# Patient Record
Sex: Male | Born: 1965 | Race: Black or African American | Hispanic: No | Smoking: Never smoker
Health system: Southern US, Community
[De-identification: ages and names within clinical notes are randomized; demographics above are authoritative.]

## PROBLEM LIST (undated history)

## (undated) DIAGNOSIS — Z923 Personal history of irradiation: Secondary | ICD-10-CM

## (undated) DIAGNOSIS — C801 Malignant (primary) neoplasm, unspecified: Secondary | ICD-10-CM

## (undated) HISTORY — DX: Personal history of irradiation: Z92.3

---

## 1997-08-17 ENCOUNTER — Ambulatory Visit (HOSPITAL_COMMUNITY): Admission: RE | Admit: 1997-08-17 | Discharge: 1997-08-17 | Payer: Self-pay | Admitting: *Deleted

## 1999-02-26 HISTORY — PX: KELOID EXCISION: SHX1856

## 1999-05-17 ENCOUNTER — Encounter: Admission: RE | Admit: 1999-05-17 | Discharge: 1999-08-15 | Payer: Self-pay | Admitting: Family Medicine

## 1999-05-21 ENCOUNTER — Ambulatory Visit (HOSPITAL_BASED_OUTPATIENT_CLINIC_OR_DEPARTMENT_OTHER): Admission: RE | Admit: 1999-05-21 | Discharge: 1999-05-21 | Payer: Self-pay | Admitting: Specialist

## 2001-03-30 ENCOUNTER — Emergency Department (HOSPITAL_COMMUNITY): Admission: EM | Admit: 2001-03-30 | Discharge: 2001-03-30 | Payer: Self-pay | Admitting: *Deleted

## 2002-06-25 ENCOUNTER — Ambulatory Visit: Admission: RE | Admit: 2002-06-25 | Discharge: 2002-07-07 | Payer: Self-pay | Admitting: Radiation Oncology

## 2002-08-22 ENCOUNTER — Emergency Department (HOSPITAL_COMMUNITY): Admission: EM | Admit: 2002-08-22 | Discharge: 2002-08-22 | Payer: Self-pay | Admitting: Emergency Medicine

## 2002-08-22 ENCOUNTER — Encounter: Payer: Self-pay | Admitting: Emergency Medicine

## 2004-02-26 HISTORY — PX: CHOLECYSTECTOMY: SHX55

## 2004-03-30 ENCOUNTER — Emergency Department (HOSPITAL_COMMUNITY): Admission: EM | Admit: 2004-03-30 | Discharge: 2004-03-30 | Payer: Self-pay | Admitting: Emergency Medicine

## 2004-08-04 ENCOUNTER — Emergency Department (HOSPITAL_COMMUNITY): Admission: EM | Admit: 2004-08-04 | Discharge: 2004-08-04 | Payer: Self-pay | Admitting: Emergency Medicine

## 2004-08-15 ENCOUNTER — Emergency Department (HOSPITAL_COMMUNITY): Admission: EM | Admit: 2004-08-15 | Discharge: 2004-08-15 | Payer: Self-pay | Admitting: Emergency Medicine

## 2004-12-28 ENCOUNTER — Observation Stay (HOSPITAL_COMMUNITY): Admission: EM | Admit: 2004-12-28 | Discharge: 2004-12-29 | Payer: Self-pay | Admitting: Emergency Medicine

## 2004-12-28 ENCOUNTER — Encounter (INDEPENDENT_AMBULATORY_CARE_PROVIDER_SITE_OTHER): Payer: Self-pay | Admitting: Specialist

## 2005-12-30 ENCOUNTER — Encounter: Admission: RE | Admit: 2005-12-30 | Discharge: 2006-03-30 | Payer: Self-pay | Admitting: Orthopedic Surgery

## 2006-04-11 ENCOUNTER — Emergency Department (HOSPITAL_COMMUNITY): Admission: EM | Admit: 2006-04-11 | Discharge: 2006-04-11 | Payer: Self-pay | Admitting: Family Medicine

## 2007-04-04 ENCOUNTER — Emergency Department (HOSPITAL_COMMUNITY): Admission: EM | Admit: 2007-04-04 | Discharge: 2007-04-04 | Payer: Self-pay | Admitting: Family Medicine

## 2007-06-25 ENCOUNTER — Emergency Department (HOSPITAL_COMMUNITY): Admission: EM | Admit: 2007-06-25 | Discharge: 2007-06-25 | Payer: Self-pay | Admitting: Emergency Medicine

## 2008-09-24 ENCOUNTER — Emergency Department (HOSPITAL_BASED_OUTPATIENT_CLINIC_OR_DEPARTMENT_OTHER): Admission: EM | Admit: 2008-09-24 | Discharge: 2008-09-24 | Payer: Self-pay | Admitting: Emergency Medicine

## 2008-09-24 ENCOUNTER — Ambulatory Visit: Payer: Self-pay | Admitting: Diagnostic Radiology

## 2008-11-08 ENCOUNTER — Encounter: Admission: RE | Admit: 2008-11-08 | Discharge: 2008-11-08 | Payer: Self-pay | Admitting: Orthopedic Surgery

## 2008-12-10 ENCOUNTER — Emergency Department (HOSPITAL_COMMUNITY): Admission: EM | Admit: 2008-12-10 | Discharge: 2008-12-10 | Payer: Self-pay | Admitting: Emergency Medicine

## 2009-01-28 ENCOUNTER — Emergency Department (HOSPITAL_COMMUNITY): Admission: EM | Admit: 2009-01-28 | Discharge: 2009-01-28 | Payer: Self-pay | Admitting: Emergency Medicine

## 2009-02-03 ENCOUNTER — Emergency Department (HOSPITAL_BASED_OUTPATIENT_CLINIC_OR_DEPARTMENT_OTHER): Admission: EM | Admit: 2009-02-03 | Discharge: 2009-02-03 | Payer: Self-pay | Admitting: Emergency Medicine

## 2009-09-17 ENCOUNTER — Emergency Department (HOSPITAL_BASED_OUTPATIENT_CLINIC_OR_DEPARTMENT_OTHER): Admission: EM | Admit: 2009-09-17 | Discharge: 2009-09-17 | Payer: Self-pay | Admitting: Emergency Medicine

## 2010-03-19 ENCOUNTER — Ambulatory Visit: Admission: RE | Admit: 2010-03-19 | Payer: Self-pay | Source: Home / Self Care | Admitting: Specialist

## 2010-05-12 LAB — COMPREHENSIVE METABOLIC PANEL
ALT: 26 U/L (ref 0–53)
Calcium: 9 mg/dL (ref 8.4–10.5)
GFR calc Af Amer: >60 mL/min (ref >60)
Glucose, Bld: 92 mg/dL (ref 70–99)
Sodium: 145 mEq/L (ref 135–145)
Total Protein: 8 g/dL (ref 6.0–8.3)

## 2010-05-12 LAB — URINALYSIS, ROUTINE W REFLEX MICROSCOPIC
Glucose, UA: NEGATIVE mg/dL
Ketones, ur: NEGATIVE mg/dL
Specific Gravity, Urine: 1.03 (ref 1.005–1.030)
pH: 5.5 (ref 5.0–8.0)

## 2010-05-12 LAB — DIFFERENTIAL
Eosinophils Absolute: 0.1 10*3/uL (ref 0.0–0.7)
Lymphocytes Relative: 31 % (ref 12–46)
Lymphs Abs: 2.4 10*3/uL (ref 0.7–4.0)
Monocytes Relative: 9 % (ref 3–12)
Neutrophils Relative %: 57 % (ref 43–77)

## 2010-05-12 LAB — CBC
MCV: 86 fL (ref 78.0–100.0)
Platelets: 266 10*3/uL (ref 150–400)
RDW: 12.4 % (ref 11.5–15.5)
WBC: 7.7 10*3/uL (ref 4.0–10.5)

## 2010-07-13 NOTE — Op Note (Signed)
[place]. [facility]  Patient:    Duane Jackson, Duane Jackson                     MRN: Proc. Date: 05/21/99 Attending:  Earvin Hansen L. Shon Hough, M.D. CC:         Duane Jackson. Shon Hough, M.D.                           Operative Report  PREOPERATIVE DIAGNOSIS:  POSTOPERATIVE DIAGNOSIS:  OPERATION PERFORMED:  Wide excision of folliculoma, folliculitis of the scalp, keloid and then flap closure.  SURGEON:  Duane Jackson. Shon Hough, M.D.  ASSISTANT:  ANESTHESIA:  General.  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 The patient has had to have serial injections of steroid medications to keep the areas under control from burning, itching sensations.  He is now ready for having excision of the area with low dose postoperative irradiation planned.  DESCRIPTION OF PROCEDURE:  The patient underwent general anesthesia intubated orally.  He was then placed in a prone position.  Prep was done to the scalp and neck areas using Betadine soap and solution and walled off with sterile towels nd drapes so as to make a sterile field.  Wide excision was made of the area of the scalp down to underlying fascia overlying the musculature.  Hemostasis was maintained with the Bovie unit on coagulation after removing the area with some  difficulty, increased scarring and bleeding.  The superior and inferior flaps were freed up approximately 4 cm each to allow closure of the defects without tension using 2-0 Monocryl x 2 layers and then a running locking suture of 3-0 nylon. he wound was drained with #10 Blake drain which was placed in the depths of the wound and brought out through the lateralmost portion of the incision and secured with 3-0 Prolene.  The wounds were cleansed.  Sterile dressings were applied to all he areas.  0.5% Xylocaine with epinephrine was used  locally.  At the end of the case, 0.25% Marcaine with epinephrine for sustaining of the analgesia.  Estimated blood loss for this procedure was 100 cc.  COMPLICATIONS:  None. DD:  05/21/99 TD:  05/22/99 Job: [id number]

## 2010-07-13 NOTE — Op Note (Signed)
NAME:  Duane Jackson, Duane Jackson NO.:  [id number]   MEDICAL RECORD NO.:  [id number]          PATIENT TYPE:  OBV   LOCATION:  1610                         FACILITY:  [facility]   PHYSICIAN:  Lebron Conners, M.D.   DATE OF BIRTH:  01/01/1966   DATE OF PROCEDURE:  12/28/2004  DATE OF DISCHARGE:                                 OPERATIVE REPORT   PREOPERATIVE DIAGNOSES:  Cholelithiasis and acute cholecystitis   POSTOPERATIVE DIAGNOSES:  Cholelithiasis and acute cholecystitis   OPERATION:  Laparoscopic cholecystectomy.   SURGEON:  Lebron Conners, M.D.   ANESTHESIA:  General and local.   PROCEDURE:  After the patient was monitored and given general anesthesia and  had routine preparation and draping of the abdomen, I infiltrated the area  just below the umbilicus with long-acting local anesthetic. I made about a 2  1/2  cm transverse incision and dissected down through the fat to the  midline fascia which I opened longitudinally for about 2 cm. I bluntly  entered the peritoneal cavity and dilated the tract and then placed a #0  Vicryl pursestring suture and secured a Hassan cannula. I put in the  laparoscopic and examined the abdominal contents. The gallbladder was  slightly distended but not truly acute and in appearance. I felt that I  should be sure the patient did not have appendicitis since I did not think  the appearance of the gallbladder quite explained all of his severe pain and  fever and elevation of white count. I looked for the appendix and could not  at first see it. I mobilized the cecum by taking down adhesions and also  mobilized the distal ileum taking down adhesions with the scissors and  cautery. I could see the base of the appendix. The appendix was retrocecal  and retroperitoneal and there was no evidence of inflammation in that  region. I concluded that he did not have appendicitis and discontinued that  dissection. There was a tiny bit of oozing and I put  some Surgicel in that  area. I then had the patient positioned head-up foot down and tilted to the  left. I should mention that I had placed the three additional laparoscopic  ports in standard locations for cholecystectomy under direct view of the  laparoscope and they went in and atraumatically. I grasped the fundus of the  gallbladder and retracted it toward the right shoulder. There were some  somewhat chronic and some acute appearing adhesions of omentum to the  undersurface of the gallbladder. I dissected those away with cautery and  with blunt dissection and then was able to grasp the infundibulum of the  gallbladder and pull it laterally. I then dissected the hepatoduodenal  ligament anteriorly and posteriorly making a very wide window between the  cystic duct and the liver. I noted the emergence of the cystic duct from the  gallbladder and then noted the cystic artery crossing medial to it through  the triangle of Calot and branching on the anterior surface of the  gallbladder. Since the liver enzymes were within normal limits and the  patient had not had  any pain suggestive of choledocholithiasis, I clipped  the cystic duct with three clips and cut between the two closest to the  gallbladder. There was a small amount of sludge and tiny stones in the  cystic duct at that point. I cut the cystic artery and then completed the  dissection of the gallbladder from the liver by clipping a couple more small  blood vessel seen to be going into the gallbladder and cauterizing the  remainder of the peritoneal attachments and posterior attachments. The  hemostasis was excellent. There was one tiny hole in the fundus of the  gallbladder which the grasper had made and so when I removed the gallbladder  from the liver I placed it in a plastic pouch and held it above the liver. I  copiously irrigated the right upper quadrant and removed the irrigant and  then again inspected the dissected area  around the cecum and found that  hemostasis was good there as well. I carefully examined the small bowel and  other viscera and saw no other abnormalities. I removed the gallbladder from  the body through the umbilical incision and tied the pursestring suture  and then removed the two lateral ports under direct view after removing the  remaining irrigant. I allowed the carbon dioxide to escape from the  epigastric port and removed that as well. I closed all of the skin incisions  accurately with intracuticular 4-0 Vicryl and Steri-Strips and applied  bandages. He tolerated the operation well.      Lebron Conners, M.D.  Electronically Signed     WB/MEDQ  D:  12/28/2004  T:  12/28/2004  Job:  [id number]

## 2011-02-25 ENCOUNTER — Encounter (HOSPITAL_BASED_OUTPATIENT_CLINIC_OR_DEPARTMENT_OTHER): Payer: Self-pay | Admitting: *Deleted

## 2011-02-25 NOTE — Progress Notes (Signed)
No meds Waiting on dr Irven Coe oders-pt to come in for labs

## 2011-03-01 ENCOUNTER — Other Ambulatory Visit: Payer: Self-pay | Admitting: Specialist

## 2011-03-01 MED ORDER — DEXTROSE IN LACTATED RINGERS 5 % IV SOLN: INTRAVENOUS | Status: DC

## 2011-03-01 NOTE — H&P (Signed)
Duane Jackson is an 45 y.o. male.   Chief Complaint: mass on left face HPI:   Past Medical History  Diagnosis Date  . No pertinent past medical history     Past Surgical History  Procedure Date  . Cholecystectomy 2006    lap choli  . Keloid excision 2001    scalp    No family history on file. Social History:  reports that he has never smoked. He does not have any smokeless tobacco history on file. He reports that he drinks alcohol. He reports that he does not use illicit drugs.  Allergies:  Allergies  Allergen Reactions  . Codeine Nausea And Vomiting    Medications Prior to Admission  Medication Sig Dispense Refill  . acetaminophen (TYLENOL) 325 MG tablet Take 650 mg by mouth every 6 (six) hours as needed.         Medications Prior to Admission  Medication Dose Route Frequency Provider Last Rate Last Dose  . dextrose 5 % in lactated ringers infusion   Intravenous Continuous Ilya Ess Jackson Ademide Schaberg        No results found for this or any previous visit (from the past 48 hour(s)). No results found.  ROS  There were no vitals taken for this visit. Physical Exam   Assessment/Plan   Duane Jackson 03/01/2011, 11:58 AM    

## 2011-03-04 ENCOUNTER — Encounter (HOSPITAL_BASED_OUTPATIENT_CLINIC_OR_DEPARTMENT_OTHER): Payer: Self-pay | Admitting: *Deleted

## 2011-03-04 ENCOUNTER — Ambulatory Visit (HOSPITAL_BASED_OUTPATIENT_CLINIC_OR_DEPARTMENT_OTHER): Payer: BC Managed Care – PPO | Admitting: Anesthesiology

## 2011-03-04 ENCOUNTER — Encounter (HOSPITAL_BASED_OUTPATIENT_CLINIC_OR_DEPARTMENT_OTHER): Payer: Self-pay | Admitting: Anesthesiology

## 2011-03-04 ENCOUNTER — Other Ambulatory Visit: Payer: Self-pay | Admitting: Specialist

## 2011-03-04 ENCOUNTER — Ambulatory Visit (HOSPITAL_BASED_OUTPATIENT_CLINIC_OR_DEPARTMENT_OTHER)
Admission: RE | Admit: 2011-03-04 | Discharge: 2011-03-04 | Disposition: A | Discharge status: Home / Self Care | Payer: BC Managed Care – PPO | Source: Ambulatory Visit | Attending: Specialist | Admitting: Specialist

## 2011-03-04 ENCOUNTER — Encounter (HOSPITAL_BASED_OUTPATIENT_CLINIC_OR_DEPARTMENT_OTHER)
Admission: RE | Disposition: A | Discharge status: Home / Self Care | Payer: Self-pay | Source: Ambulatory Visit | Attending: Specialist

## 2011-03-04 DIAGNOSIS — C07 Malignant neoplasm of parotid gland: Secondary | ICD-10-CM | POA: Insufficient documentation

## 2011-03-04 DIAGNOSIS — C801 Malignant (primary) neoplasm, unspecified: Secondary | ICD-10-CM

## 2011-03-04 HISTORY — DX: Malignant (primary) neoplasm, unspecified: C80.1

## 2011-03-04 HISTORY — PX: MASS EXCISION: SHX2000

## 2011-03-04 SURGERY — EXCISION MASS
Anesthesia: General | Site: Face | Laterality: Left | Wound class: Clean

## 2011-03-04 MED ORDER — SODIUM CHLORIDE 0.9 % IV SOLN
INTRAVENOUS | Status: DC | PRN
  Administered 2011-03-04: 5 mL via INTRAMUSCULAR

## 2011-03-04 MED ORDER — PROMETHAZINE HCL 25 MG/ML IJ SOLN: 6.2500 mg | INTRAMUSCULAR | Status: DC | PRN

## 2011-03-04 MED ORDER — MORPHINE SULFATE 2 MG/ML IJ SOLN: 0.0500 mg/kg | INTRAMUSCULAR | Status: DC | PRN

## 2011-03-04 MED ORDER — DEXAMETHASONE SODIUM PHOSPHATE 4 MG/ML IJ SOLN
INTRAMUSCULAR | Status: DC | PRN
  Administered 2011-03-04: 10 mg via INTRAVENOUS

## 2011-03-04 MED ORDER — LIDOCAINE-EPINEPHRINE 0.5-1:200000 % IJ SOLN
INTRAMUSCULAR | Status: DC | PRN
  Administered 2011-03-04: 8 mL

## 2011-03-04 MED ORDER — CEFAZOLIN SODIUM 1-5 GM-% IV SOLN: 1.0000 g | INTRAVENOUS | Status: DC

## 2011-03-04 MED ORDER — HYDROCODONE-ACETAMINOPHEN 5-325 MG PO TABS
1.0000 | ORAL_TABLET | Freq: Once | ORAL | Status: AC
  Administered 2011-03-04: 1 via ORAL

## 2011-03-04 MED ORDER — LIDOCAINE HCL (CARDIAC) 20 MG/ML IV SOLN
INTRAVENOUS | Status: DC | PRN
  Administered 2011-03-04: 80 mg via INTRAVENOUS

## 2011-03-04 MED ORDER — LACTATED RINGERS IV SOLN: INTRAVENOUS | Status: DC

## 2011-03-04 MED ORDER — CEFAZOLIN SODIUM-DEXTROSE 2-3 GM-% IV SOLR
2.0000 g | INTRAVENOUS | Status: AC
  Administered 2011-03-04: 2 g via INTRAVENOUS

## 2011-03-04 MED ORDER — LACTATED RINGERS IV SOLN
INTRAVENOUS | Status: DC
  Administered 2011-03-04: 08:00:00 via INTRAVENOUS

## 2011-03-04 MED ORDER — HYDROMORPHONE HCL PF 1 MG/ML IJ SOLN: 0.2500 mg | INTRAMUSCULAR | Status: DC | PRN

## 2011-03-04 MED ORDER — ONDANSETRON HCL 4 MG/2ML IJ SOLN
INTRAMUSCULAR | Status: DC | PRN
  Administered 2011-03-04: 4 mg via INTRAVENOUS

## 2011-03-04 MED ORDER — MIDAZOLAM HCL 5 MG/5ML IJ SOLN
INTRAMUSCULAR | Status: DC | PRN
  Administered 2011-03-04: 2 mg via INTRAVENOUS

## 2011-03-04 MED ORDER — TRIAMCINOLONE ACETONIDE 40 MG/ML IJ SUSP
INTRAMUSCULAR | Status: DC | PRN
  Administered 2011-03-04: 40 mg via INTRADERMAL

## 2011-03-04 MED ORDER — PROPOFOL 10 MG/ML IV EMUL
INTRAVENOUS | Status: DC | PRN
  Administered 2011-03-04: 250 mg via INTRAVENOUS

## 2011-03-04 MED ORDER — FENTANYL CITRATE 0.05 MG/ML IJ SOLN
INTRAMUSCULAR | Status: DC | PRN
  Administered 2011-03-04: 50 ug via INTRAVENOUS
  Administered 2011-03-04 (×2): 25 ug via INTRAVENOUS

## 2011-03-04 MED ORDER — MEPERIDINE HCL 25 MG/ML IJ SOLN: 6.2500 mg | INTRAMUSCULAR | Status: DC | PRN

## 2011-03-04 MED ORDER — LACTATED RINGERS IV SOLN
INTRAVENOUS | Status: DC
  Administered 2011-03-04: 07:00:00 via INTRAVENOUS

## 2011-03-04 SURGICAL SUPPLY — 54 items
APL SKNCLS STERI-STRIP NONHPOA (GAUZE/BANDAGES/DRESSINGS) ×1
BALL CTTN LRG ABS STRL LF (GAUZE/BANDAGES/DRESSINGS) ×1
BANDAGE GAUZE ELAST BULKY 4 IN (GAUZE/BANDAGES/DRESSINGS) IMPLANT
BENZOIN TINCTURE PRP APPL 2/3 (GAUZE/BANDAGES/DRESSINGS) ×1 IMPLANT
BLADE KNIFE PERSONA 10 (BLADE) ×2 IMPLANT
BLADE KNIFE PERSONA 15 (BLADE) ×2 IMPLANT
CANISTER SUCTION 1200CC (MISCELLANEOUS) IMPLANT
CLEANER CAUTERY TIP 5X5 PAD (MISCELLANEOUS) ×1 IMPLANT
CLOTH BEACON ORANGE TIMEOUT ST (SAFETY) ×2 IMPLANT
COTTONBALL LRG STERILE PKG (GAUZE/BANDAGES/DRESSINGS) ×1 IMPLANT
COVER MAYO STAND STRL (DRAPES) ×2 IMPLANT
COVER TABLE BACK 60X90 (DRAPES) ×2 IMPLANT
DRAPE PED LAPAROTOMY (DRAPES) IMPLANT
DRAPE U-SHAPE 76X120 STRL (DRAPES) ×1 IMPLANT
DRSG PAD ABDOMINAL 8X10 ST (GAUZE/BANDAGES/DRESSINGS) IMPLANT
ELECT NDL TIP 2.8 STRL (NEEDLE) ×1 IMPLANT
ELECT NEEDLE TIP 2.8 STRL (NEEDLE) ×2 IMPLANT
ELECT REM PT RETURN 9FT ADLT (ELECTROSURGICAL) ×2
ELECTRODE REM PT RTRN 9FT ADLT (ELECTROSURGICAL) ×1 IMPLANT
GAUZE SPONGE 4X4 12PLY STRL LF (GAUZE/BANDAGES/DRESSINGS) ×1 IMPLANT
GAUZE SPONGE 4X4 16PLY XRAY LF (GAUZE/BANDAGES/DRESSINGS) IMPLANT
GAUZE XEROFORM 5X9 LF (GAUZE/BANDAGES/DRESSINGS) IMPLANT
GLOVE BIO SURGEON STRL SZ 6.5 (GLOVE) ×1 IMPLANT
GLOVE BIOGEL M STRL SZ7.5 (GLOVE) ×1 IMPLANT
GLOVE ECLIPSE 7.0 STRL STRAW (GLOVE) ×2 IMPLANT
GLOVE INDICATOR 8.0 STRL GRN (GLOVE) ×1 IMPLANT
GOWN PREVENTION PLUS XLARGE (GOWN DISPOSABLE) ×1 IMPLANT
GOWN PREVENTION PLUS XXLARGE (GOWN DISPOSABLE) ×2 IMPLANT
LOCATOR NERVE 3 VOLT (DISPOSABLE) ×1 IMPLANT
NDL HYPO 25X1 1.5 SAFETY (NEEDLE) ×1 IMPLANT
NDL SAFETY ECLIPSE 18X1.5 (NEEDLE) IMPLANT
NEEDLE HYPO 18GX1.5 SHARP (NEEDLE) ×2
NEEDLE HYPO 25X1 1.5 SAFETY (NEEDLE) ×4 IMPLANT
PACK BASIN DAY SURGERY FS (CUSTOM PROCEDURE TRAY) ×2 IMPLANT
PAD CLEANER CAUTERY TIP 5X5 (MISCELLANEOUS)
PENCIL BUTTON HOLSTER BLD 10FT (ELECTRODE) ×2 IMPLANT
SHEET MEDIUM DRAPE 40X70 STRL (DRAPES) ×1 IMPLANT
SPONGE GAUZE 4X4 12PLY (GAUZE/BANDAGES/DRESSINGS) IMPLANT
STAPLER VISISTAT 35W (STAPLE) ×1 IMPLANT
STERI STRIP BROWN 1/4X3 R155 (GAUZE/BANDAGES/DRESSINGS) ×1 IMPLANT
STRIP SUTURE WOUND CLOSURE 1/2 (SUTURE) IMPLANT
SUCTION FRAZIER TIP 10 FR DISP (SUCTIONS) IMPLANT
SUT MNCRL AB 3-0 PS2 18 (SUTURE) ×1 IMPLANT
SUT PROLENE 4 0 P 3 18 (SUTURE) IMPLANT
SUT PROLENE 4 0 PS 2 18 (SUTURE) IMPLANT
SUT SILK 3 0 PS 1 (SUTURE) IMPLANT
SUT VIC AB 3-0 FS2 27 (SUTURE) IMPLANT
SYR CONTROL 10ML LL (SYRINGE) ×3 IMPLANT
TAPE HYPAFIX 4 X10 (GAUZE/BANDAGES/DRESSINGS) ×1 IMPLANT
TAPE HYPAFIX 6X30 (GAUZE/BANDAGES/DRESSINGS) IMPLANT
TOWEL OR 17X24 6PK STRL BLUE (TOWEL DISPOSABLE) ×4 IMPLANT
TUBE CONNECTING 20X1/4 (TUBING) IMPLANT
UNDERPAD 30X30 INCONTINENT (UNDERPADS AND DIAPERS) ×1 IMPLANT
WATER STERILE IRR 1000ML POUR (IV SOLUTION) ×1 IMPLANT

## 2011-03-04 NOTE — Anesthesia Procedure Notes (Signed)
Procedure Name: LMA Insertion Performed by: Maralyn Witherell Pre-anesthesia Checklist: Patient identified, Timeout performed, Emergency Drugs available, Suction available and Patient being monitored Patient Re-evaluated:Patient Re-evaluated prior to inductionOxygen Delivery Method: Circle System Utilized Preoxygenation: Pre-oxygenation with 100% oxygen Intubation Type: IV induction Ventilation: Mask ventilation without difficulty LMA: LMA with gastric port inserted LMA Size: 4.0 Number of attempts: 1 Placement Confirmation: breath sounds checked- equal and bilateral and positive ETCO2 Tube secured with: Tape Dental Injury: Teeth and Oropharynx as per pre-operative assessment      

## 2011-03-04 NOTE — Interval H&P Note (Signed)
History and Physical Interval Note:  03/04/2011 7:48 AM  Duane Jackson  has presented today for surgery, with the diagnosis of mass  The various methods of treatment have been discussed with the patient and family. After consideration of risks, benefits and other options for treatment, the patient has consented to  Procedure(s): EXCISION MASS as a surgical intervention .  The patients' history has been reviewed, patient examined, no change in status, stable for surgery.  I have reviewed the patients' chart and labs.  Questions were answered to the patient's satisfaction.     Carlos Heber L

## 2011-03-04 NOTE — Anesthesia Postprocedure Evaluation (Signed)
  Anesthesia Post-op Note  Patient: Duane Jackson  Procedure(s) Performed:  EXCISION MASS - excision mass left face   Patient Location: PACU  Anesthesia Type: General  Level of Consciousness: awake  Airway and Oxygen Therapy: Patient Spontanous Breathing  Post-op Pain: mild  Post-op Assessment: Post-op Vital signs reviewed  Post-op Vital Signs: stable  Complications: No apparent anesthesia complications

## 2011-03-04 NOTE — Anesthesia Preprocedure Evaluation (Signed)
Anesthesia Evaluation  Patient identified by MRN, date of birth, ID band  Reviewed: Allergy & Precautions, H&P , NPO status   Airway Mallampati: II      Dental   Pulmonary  clear to auscultation        Cardiovascular neg cardio ROS Regular Normal    Neuro/Psych    GI/Hepatic negative GI ROS, Neg liver ROS,   Endo/Other  Negative Endocrine ROS  Renal/GU negative Renal ROS     Musculoskeletal negative musculoskeletal ROS (+)   Abdominal   Peds  Hematology negative hematology ROS (+)   Anesthesia Other Findings   Reproductive/Obstetrics                           Anesthesia Physical Anesthesia Plan  ASA: I  Anesthesia Plan: General   Post-op Pain Management:    Induction: Intravenous  Airway Management Planned: Mask  Additional Equipment:   Intra-op Plan:   Post-operative Plan:   Informed Consent: I have reviewed the patients History and Physical, chart, labs and discussed the procedure including the risks, benefits and alternatives for the proposed anesthesia with the patient or authorized representative who has indicated his/her understanding and acceptance.     Plan Discussed with:   Anesthesia Plan Comments:         Anesthesia Quick Evaluation

## 2011-03-04 NOTE — H&P (View-Only) (Signed)
Joshawa Dubin is an 46 y.o. male.   Chief Complaint: mass on left face HPI:   Past Medical History  Diagnosis Date  . No pertinent past medical history     Past Surgical History  Procedure Date  . Cholecystectomy 2006    lap choli  . Keloid excision 2001    scalp    No family history on file. Social History:  reports that he has never smoked. He does not have any smokeless tobacco history on file. He reports that he drinks alcohol. He reports that he does not use illicit drugs.  Allergies:  Allergies  Allergen Reactions  . Codeine Nausea And Vomiting    Medications Prior to Admission  Medication Sig Dispense Refill  . acetaminophen (TYLENOL) 325 MG tablet Take 650 mg by mouth every 6 (six) hours as needed.         Medications Prior to Admission  Medication Dose Route Frequency Provider Last Rate Last Dose  . dextrose 5 % in lactated ringers infusion   Intravenous Continuous Yaakov Guthrie Airica Schwartzkopf        No results found for this or any previous visit (from the past 48 hour(s)). No results found.  ROS  There were no vitals taken for this visit. Physical Exam   Assessment/Plan   Sheyli Horwitz L 03/01/2011, 11:58 AM

## 2011-03-04 NOTE — Brief Op Note (Signed)
03/04/2011  9:02 AM  PATIENT:  Duane Jackson  46 y.o. male  PRE-OPERATIVE DIAGNOSIS:  mass left face  POST-OPERATIVE DIAGNOSIS:  mass left face  PROCEDURE:  Procedure(s): EXCISION MASS  SURGEON:  Surgeon(s): Yaakov Guthrie Aasha Dina  PHYSICIAN ASSISTANT:   ASSISTANTS: none   ANESTHESIA:   general  EBL:  Total I/O In: 1500 [I.V.:1500] Out: -   BLOOD ADMINISTERED:none  DRAINS: none   LOCAL MEDICATIONS USED:  XYLOCAINE 10CC  SPECIMEN:  Excision  DISPOSITION OF SPECIMEN:  PATHOLOGY  COUNTS:  YES  TOURNIQUET:  * No tourniquets in log *  DICTATION: .Other Dictation: Dictation Number [id number]  PLAN OF CARE: Discharge to home after PACU  PATIENT DISPOSITION:  PACU - hemodynamically stable.   Delay start of Pharmacological VTE agent (>24hrs) due to surgical blood loss or risk of bleeding:  {YES/NO/NOT APPLICABLE:20182

## 2011-03-04 NOTE — Op Note (Signed)
NAMEANTONIOS, OSTROW NO.:  [id number]  MEDICAL RECORD NO.:  [id number]  LOCATION:                                 FACILITY:  PHYSICIAN:  Earvin Hansen L. Shon Hough, M.D.DATE OF BIRTH:  05-20-1965  DATE OF PROCEDURE:  03/04/2011 DATE OF DISCHARGE:                              OPERATIVE REPORT   A 46 year old gentleman with a left parotid tumor, increased in size over the last 6-8 months.  PROCEDURES DONE:  Exploration of left face, left superficial parotidectomy with excision of mass with dissection of the facial nerve.  SURGEON:  Yaakov Guthrie. Shon Hough, MD  ANESTHESIA:  General.  The patient underwent general anesthesia and intubated orally.  Prep was done to the face and neck areas using Betadine soap and solution, walled off with sterile towels and drapes so as to make a sterile field.  0.5% Xylocaine with epinephrine was injected into the incision lines, traditional incision markings, pre and postauricular in a curvy lazy S, I used a total of 10 mL.  An incision was made over these areas with a #15 blade.  The dissection carried down to superficial fascia.  I was able then to dissect out the mass and then trace out the main trunk of the facial nerve.  I dissected it over using delicate dissection and removing the mass in the superficial parotid area at the same time.  I did use an intraoperative nerve stimulator for guidance.  After mass had been removed, small bleeders were controlled with the Bovie anticoagulation, small 10.  After irrigation, after proper hemostasis, the flaps were then transposed.  This patient has a history of keloidosis and to hopefully prevent that in the future I injected the edges of the incisions with triamcinolone 20 mg/mL, a total approximately of 2 mL throughout the area, 3-0 Monocryl suture subcutaneously, then a running subcuticular stitch of 3-0 Monocryl. Steri-Strips and soft dressing were applied to all the areas.   He withstood the procedures very well and was taken to recovery in excellent condition.     Yaakov Guthrie. Shon Hough, M.D.     Cathie Hoops  D:  03/04/2011  T:  03/04/2011  Job:  [id number]

## 2011-03-04 NOTE — Transfer of Care (Signed)
Immediate Anesthesia Transfer of Care Note  Patient: Duane Jackson  Procedure(s) Performed:  EXCISION MASS - excision mass left face   Patient Location: PACU  Anesthesia Type: General  Level of Consciousness: awake  Airway & Oxygen Therapy: Patient Spontanous Breathing  Post-op Assessment: Report given to PACU RN and Post -op Vital signs reviewed and stable  Post vital signs: Reviewed and stable  Complications: No apparent anesthesia complications

## 2011-03-05 ENCOUNTER — Encounter (HOSPITAL_BASED_OUTPATIENT_CLINIC_OR_DEPARTMENT_OTHER): Payer: Self-pay | Admitting: Specialist

## 2011-03-14 ENCOUNTER — Telehealth: Payer: Self-pay | Admitting: Oncology

## 2011-03-14 NOTE — Telephone Encounter (Signed)
called pts lmovm to rtn call to schedule new pt appt  °

## 2011-03-15 ENCOUNTER — Encounter: Payer: Self-pay | Admitting: *Deleted

## 2011-03-15 ENCOUNTER — Ambulatory Visit: Payer: BC Managed Care – PPO | Admitting: Radiation Oncology

## 2011-03-15 ENCOUNTER — Ambulatory Visit
Admission: RE | Admit: 2011-03-15 | Discharge: 2011-03-15 | Disposition: A | Discharge status: Home / Self Care | Payer: BC Managed Care – PPO | Source: Ambulatory Visit | Attending: Radiation Oncology | Admitting: Radiation Oncology

## 2011-03-15 ENCOUNTER — Ambulatory Visit: Payer: BC Managed Care – PPO

## 2011-03-15 ENCOUNTER — Encounter: Payer: Self-pay | Admitting: Radiation Oncology

## 2011-03-15 ENCOUNTER — Telehealth: Payer: Self-pay | Admitting: Oncology

## 2011-03-15 VITALS — BP 135/82 | HR 76 | Temp 98.3°F | Resp 20 | Wt 194.1 lb

## 2011-03-15 DIAGNOSIS — C07 Malignant neoplasm of parotid gland: Secondary | ICD-10-CM

## 2011-03-15 DIAGNOSIS — Z923 Personal history of irradiation: Secondary | ICD-10-CM

## 2011-03-15 HISTORY — DX: Malignant (primary) neoplasm, unspecified: C80.1

## 2011-03-15 NOTE — Progress Notes (Signed)
Please see the Nurse Progress Note in the MD Initial Consult Encounter for this patient. 

## 2011-03-15 NOTE — Progress Notes (Signed)
Pt has no c/o today. Works as Nutritional therapist, Community education officer.

## 2011-03-15 NOTE — Progress Notes (Signed)
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 male who has a history of keloids. He was previously treated by Dr. Dayton Scrape for a keloid over the posterior right and left neck. He received 1050 cGy in 3 fractions over the period of one week between March 27 and 05/28/1999. The patient then developed a recurrent keloid over his posterior neck which may have been due to an infection at the suture line. He was then seen by Dr. Dan Humphreys and received a second course of radiation to the posterior neck of 1200 cGy in 3 fractions which he completed on 07/02/2002.  The patient reports that about 1 and 1/2 years ago he developed a bump on the left side of his face. He tried antibiotics as prescribed by his primary doctor but this did not help. He then saw an otolaryngologist at [facility], [place] who recommended a parotidectomy but the patient reports that this would' have involved a scar up to his frontal scalp and he was concerned that this could cause a large keloid. He then saw Dr. Shon Hough who has performed to keloid surgery for him in the past. He recommended a superficial parotidectomy with minimal incisions. He underwent surgery on 03/04/2011. The pathology revealed muco-epidermoid carcinoma that was low-grade measuring 1.5 cm. There is no evidence of angiolymphatic invasion and the resection margins were clear. The distance of tumor from closest margin was 0.1 cm. The mass with well-circumscribed and cystic. No lymph nodes were removed. The patient is recovering well from surgery. He has no other complaints. A consultation has been  requested with Dr. Gaylyn Rong of medical oncology.  PREVIOUS RADIATION THERAPY: Yes - as above   PAST MEDICAL HISTORY:  has a past medical history of No pertinent past medical history; S/P radiation therapy (05/22/99, 05/25/99, 05/28/99); and Cancer (03/04/11).     PAST SURGICAL HISTORY:  Past Surgical History  Procedure Date  . Cholecystectomy 2006    lap choli  . Keloid excision 2001    scalp  . Mass excision 03/04/2011    Procedure: EXCISION MASS;  Surgeon: Rosalio Macadamia;  Location: [facility];  Service: Plastics;  Laterality: Left;  excision mass left face      FAMILY HISTORY: family history is not on file.   SOCIAL HISTORY:  reports that he has never smoked. He does not have any smokeless tobacco history on file. He reports that he drinks alcohol. He reports that he does not use illicit drugs.   ALLERGIES: Codeine   MEDICATIONS:  Current Outpatient Prescriptions  Medication Sig Dispense Refill  . acetaminophen (TYLENOL) 325 MG tablet Take 650 mg by mouth every 6 (six) hours as needed.       No current facility-administered medications for this encounter.   Facility-Administered Medications Ordered in Other Encounters  Medication Dose Route Frequency Provider Last Rate Last Dose  . dextrose 5 % in lactated ringers infusion   Intravenous Continuous Rosalio Macadamia, MD          REVIEW OF SYSTEMS:  Negative for all of their systems other than that described above   PHYSICAL  EXAM:  weight is 194 lb 1.6 oz (88.043 kg). His oral temperature is 98.3 F (36.8 C). His blood pressure is 135/82 and his pulse is 76. His respiration is 20.   General: Alert and oriented, in no acute distress HEENT: Head is normocephalic.  Extraocular movements are intact. Oropharynx is clear with no mucosal lesions. He has a healing incision site in the left parotid bed Neck: Neck is supple, no palpable cervical or supraclavicular lymphadenopathy. Heart: Regular in rate and rhythm with  no murmurs, rubs, or gallops. Chest: Clear to auscultation bilaterally, with no rhonchi, wheezes, or rales. Extremities: No cyanosis or edema. Lymphatics: No concerning lymphadenopathy. Skin: He has no evidence of recurrent keloid over the posterior neck, but scarring is evident with hyperpigmentation in the area where he was previously treated with surgery and radiation Musculoskeletal: symmetric strength and muscle tone throughout. Neurologic: Cranial nerves II through XII are grossly intact. No obvious focalities. Speech is fluent. Coordination is intact. Psychiatric: Judgment and insight are intact. Affect is appropriate.    LABORATORY DATA:  Lab Results  Component Value Date   WBC 7.7 09/17/2009   HGB 14.7 09/17/2009   HCT 44.2 09/17/2009   MCV 86.0 09/17/2009   PLT 266 09/17/2009   Lab Results  Component Value Date   NA 145 09/17/2009   K 3.9 09/17/2009   CL 107 09/17/2009   CO2 24 09/17/2009   Lab Results  Component Value Date   ALT 26 09/17/2009   AST 33 09/17/2009   ALKPHOS 68 09/17/2009   BILITOT 0.7 09/17/2009   Radiology: As Above  Pathology: As Above   IMPRESSION/PLAN: This is a very pleasant 46 year old gentleman with a history of radiotherapy for keloid doses of the neck. He now presents with a left parotid gland muco-epidermoid carcinoma. This was completely resected and was low-grade. I do not see an indication for postoperative radiotherapy. His cancer specific survival is at least 95% based on the [facility] clinic data. I informed the patient of this and he was very happy to hear this news.  I do not know of any indications for postoperative chemotherapy but I will defer to Dr. Gaylyn Rong for his expertise or that.  Of note, I do not see an indication for prophylactic radiation treatment against keloids either. Usually, this is best administered within 36 hours of surgery and my understanding is that Dr. Shon Hough, according to the op note, injected triamcinolone for preventative  measures.   It is difficult to say what the underlying cause of this parotid tumor was. The patient was consented previously for a small risk of secondary malignancy from radiation. I cannot exclude the possibility that this is a secondary malignancy.  It was a pleasure meeting Mr. Maniscalco and he should not hesitate to call me if he has any questions in the future.

## 2011-03-15 NOTE — Telephone Encounter (Signed)
called pt lmovm to rtn call to schedule new pt appt for 1-30

## 2011-03-25 ENCOUNTER — Telehealth: Payer: Self-pay | Admitting: Oncology

## 2011-03-25 NOTE — Telephone Encounter (Signed)
Referred by Dr. Shon Hough Dx- Head/Necl

## 2011-03-25 NOTE — Telephone Encounter (Signed)
called pt and scheduled NEW PT appt for 03/29/2011.  faxed over a letter to Dr.[place]

## 2011-03-27 ENCOUNTER — Ambulatory Visit: Payer: BC Managed Care – PPO

## 2011-03-27 ENCOUNTER — Other Ambulatory Visit: Payer: BC Managed Care – PPO

## 2011-03-27 ENCOUNTER — Ambulatory Visit: Payer: BC Managed Care – PPO | Admitting: Oncology

## 2011-03-29 ENCOUNTER — Ambulatory Visit (HOSPITAL_BASED_OUTPATIENT_CLINIC_OR_DEPARTMENT_OTHER): Payer: BC Managed Care – PPO | Admitting: Oncology

## 2011-03-29 ENCOUNTER — Encounter: Payer: Self-pay | Admitting: Oncology

## 2011-03-29 ENCOUNTER — Other Ambulatory Visit: Payer: BC Managed Care – PPO | Admitting: Lab

## 2011-03-29 ENCOUNTER — Ambulatory Visit: Payer: BC Managed Care – PPO

## 2011-03-29 VITALS — BP 130/87 | HR 75 | Temp 97.3°F | Ht 68.0 in | Wt 193.8 lb

## 2011-03-29 DIAGNOSIS — Z9889 Other specified postprocedural states: Secondary | ICD-10-CM

## 2011-03-29 DIAGNOSIS — C07 Malignant neoplasm of parotid gland: Secondary | ICD-10-CM

## 2011-03-29 DIAGNOSIS — D490 Neoplasm of unspecified behavior of digestive system: Secondary | ICD-10-CM

## 2011-03-29 NOTE — Progress Notes (Signed)
Duane Jackson  Referral MD:  Dr. Louisa Second, M.D.  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   HPI: Duane Jackson is a 46 yo African American man with history of keloids.  He has had recurrent keloids in the back of his neck from shaving which had been ressected by Dr. Shon Hough in the past .  He developed a few months ago a progressive growth in his left parotid gland.  It was to the size of a bean.  It was thought at first that he had recurrent keloid.  He underwent CT at [facility] which was reportedly negative for any adenopathy or head/neck abnormality.  He was seen by an ENT in [place] who recommended very extensive resection.  He wanted more limited resection, and thus self referred to Dr. Shon Hough who performed simple excision on 03/04/2011 which pathology case number [id number] with resulted noted as above.  He was kindly referred to the [facility] for evaluation for any additional therapy.  Duane Jackson presented to the clinic for the first time today by himself.  The left parotid resection scar has healed nicely without erythema, purulent discharge, pain.  He has mild numbness locally at the resection site.  He denies numbness on his face or weakness of facial muscles.  He denies palpable adenopathy.  Patient denies fatigue, headache, visual changes, confusion, drenching night sweats, palpable lymph node swelling, mucositis, odynophagia, dysphagia, nausea vomiting, jaundice, chest pain, palpitation, shortness of breath, dyspnea on exertion, productive cough, gum bleeding, epistaxis, hematemesis, hemoptysis, abdominal pain, abdominal swelling, early satiety, melena, hematochezia, hematuria, skin rash, spontaneous bleeding, joint swelling, joint pain, heat or cold intolerance, bowel bladder  incontinence, back pain, focal motor weakness, paresthesia, depression, suicidal or homocidal ideation, feeling hopelessness.     Past Medical History  Diagnosis Date  . S/P radiation therapy 05/22/99, 05/25/99, 05/28/99    Posterior Neck, Keloid, 1050 cGy in 3 fractions  . Cancer 03/04/11    mucoepidermoid carcinoma, L parotid  :  Past Surgical History  Procedure Date  . Cholecystectomy 2006    lap choli  . Keloid excision 2001    scalp  . Mass excision 03/04/2011    Procedure: EXCISION MASS;  Surgeon: Rosalio Macadamia;  Location: [facility];  Service: Plastics;  Laterality: Left;  excision mass left face   :  Current Outpatient Prescriptions  Medication Sig Dispense Refill  . acetaminophen (TYLENOL) 325 MG tablet Take 650 mg by mouth every 6 (six) hours as needed.       No current facility-administered medications for this visit.   Facility-Administered Medications Ordered in Other Visits  Medication Dose Route Frequency Provider Last Rate Last Dose  . dextrose 5 % in lactated ringers infusion   Intravenous Continuous Rosalio Macadamia, MD         Allergies  Allergen Reactions  . Codeine Nausea And Vomiting  :  No family history on file.:  History   Social History  . Marital Status: Legally Separated    Spouse Name: N/A    Number of Children: N/A  . Years of Education: N/A   Occupational History  . Not on file.   Social History Main Topics  . Smoking status: Never Smoker   . Smokeless tobacco: Not on file  . Alcohol Use: Yes     socially  .  Drug Use: No  . Sexually Active:    Other Topics Concern  . Not on file   Social History Narrative  . No narrative on file   Pertinent items are noted in HPI.  Exam:  General:  well-nourished in no acute distress.  Eyes:  no scleral icterus.  ENT:  There were no oropharyngeal lesions.  Neck was without thyromegaly.  Left parotid resection scar has healed without erythema, purulent discharge,  palpable mass or pain on palpation.  Lymphatics:  Negative cervical, supraclavicular or axillary adenopathy.  Respiratory: lungs were clear bilaterally without wheezing or crackles.  Cardiovascular:  Regular rate and rhythm, S1/S2, without murmur, rub or gallop.  There was no pedal edema.  GI:  abdomen was soft, flat, nontender, nondistended, without organomegaly.  Muscoloskeletal:  no spinal tenderness of palpation of vertebral spine.  Skin exam was without echymosis, petichae.  Neuro exam was nonfocal.  Patient was able to get on and off exam table without assistance.  Gait was normal.  Patient was alerted and oriented.  Attention was good.   Language was appropriate.  Mood was normal without depression.  Speech was not pressured.  Thought content was not tangential.     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    I discussed with Duane Jackson that I need to review the CT neck from [place].  Patient does not remember the name of the CT facility.  He graciously agreed to pick up the report and CD-rom himself to deliver it to [place].  Clinically, there was no palpable adenoapthy I will present his case at our upcoming head neck cancer tumor board.    I discussed with Duane Jackson that with a clinically negative neck, neck dissection is not absolutely indicated.  I will present the case at tumor board for ENT opinion.  Again, his risk of recurrence is low given his pathology.  He has seen Rad Onc who did not think that there was indication for adjuvant radiation therapy.   Also, in my opinion, there is no indication for adjuvant chemotherapy either.  I reviewed NCCN guideline.  The recommendation is observation at this time. He has close follow up with Dr. Shon Hough.  In the future, if he develops adenopathy, then neck dissection may be considered at that time.  I educated patient on self examination for local/regional recurrence of his disease.   Disposition:  As he does not need adjuvant chemotherapy, I discharged patient back to Dr. Shon Hough.   Thank you for this referral.  The length of time of the face-to-face encounter was 30 minutes. More than 50% of time was spent counseling and coordination of care.

## 2011-04-03 ENCOUNTER — Other Ambulatory Visit: Payer: Self-pay | Admitting: Oncology

## 2011-04-03 DIAGNOSIS — C07 Malignant neoplasm of parotid gland: Secondary | ICD-10-CM

## 2011-04-03 NOTE — Progress Notes (Signed)
I called and talked to Duane Jackson about the discussion at tumor board today.  The formal recommendation was evaluation by our ENT service; review the CT of the neck (which he has yet to give [place] a copy of), and ENT discussion with Dr. Shon Hough to see whether an oncologic resection is indicated.  Pt only had excisional biopsy with negative margin (of 1mm).   Duane Jackson expressed informed understanding and will get [place] the CT neck on CD rom.  He does not want to see his ENT in [place] due to long distant.  He wanted to be referred to ENT here in [place].  I referred him to Dr. Pollyann Kennedy within 2 wks.

## 2011-04-05 ENCOUNTER — Telehealth: Payer: Self-pay | Admitting: Oncology

## 2011-04-05 NOTE — Telephone Encounter (Signed)
called pt home lmovm that he needs to call Dr. Lucky Rathke office to resolve a bill so we can schedule appt and rtn call to me to scheduled appt

## 2011-07-14 IMAGING — CR DG FINGER INDEX 2+V*L*
3 series · 3 of 3 positions shown · non-contrast
Comparison: None

CLINICAL DATA: Hyperextended left index finger with pain

LEFT INDEX FINGER 2+V

[x finger pa left]
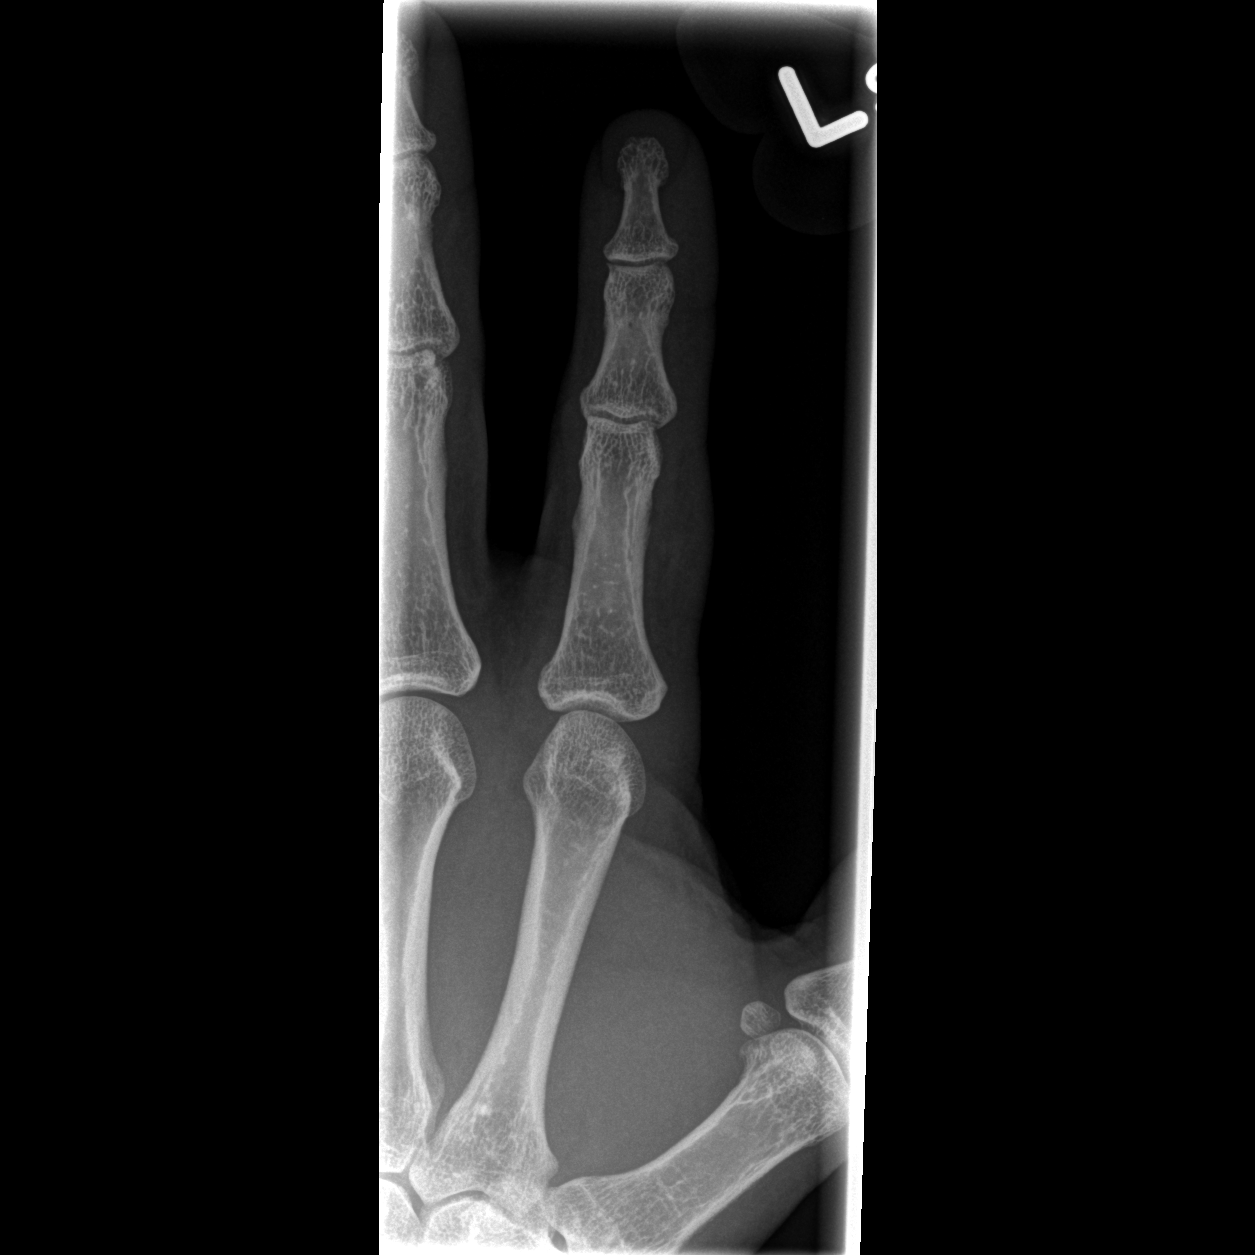

[x finger obl. left]
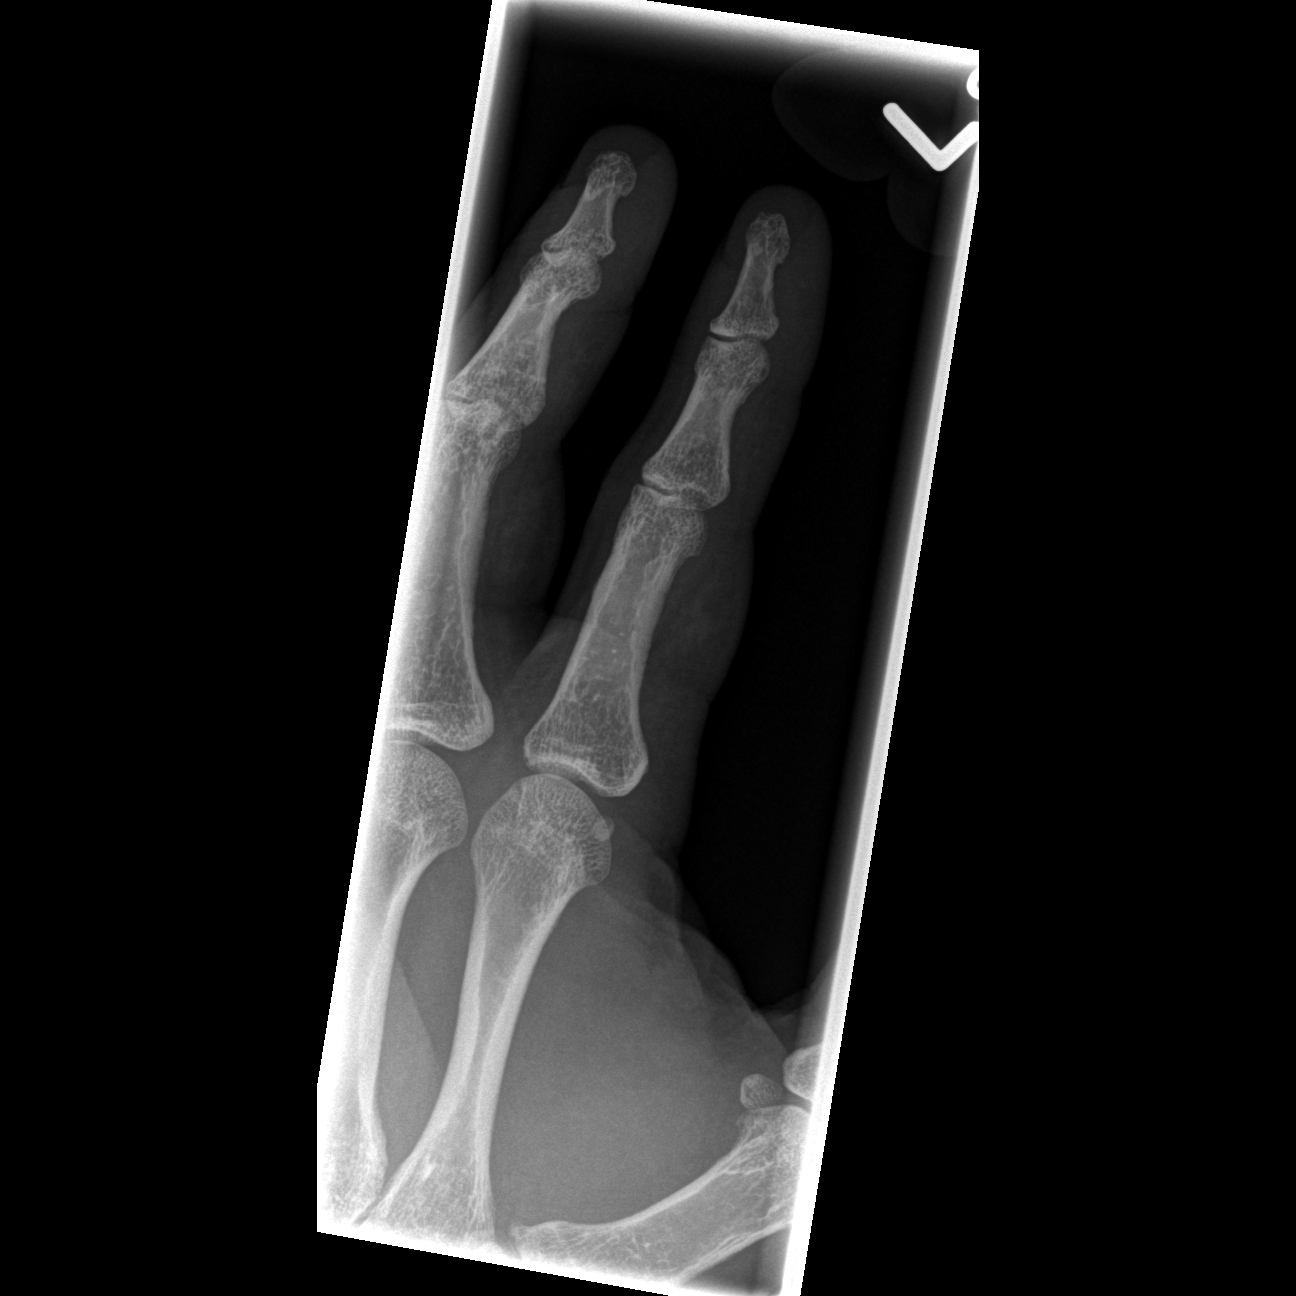

[x finger lateral left]
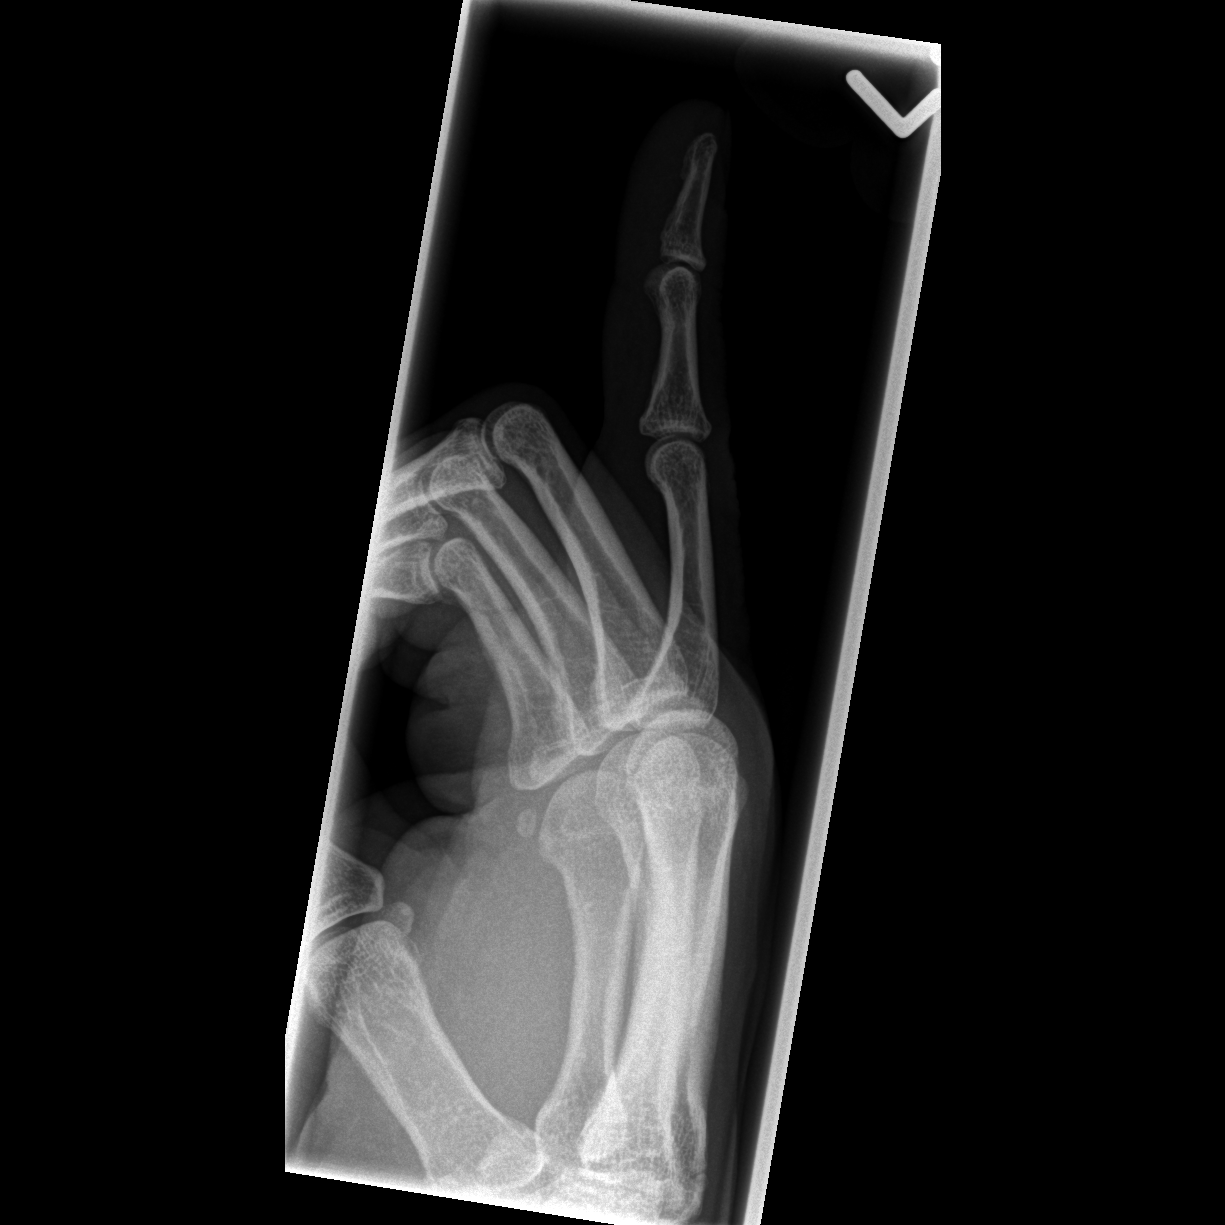

[3 of 3 positions shown; findings below may reference images not displayed]

FINDINGS: No acute fracture is seen.  Alignment is normal.  Joint
spaces are normal.
IMPRESSION: Negative.

## 2011-07-24 NOTE — Progress Notes (Signed)
Encounter addended by: Glennie Hawk, RN on: 07/24/2011  2:16 PM<BR>     Documentation filed: Charges VN

## 2012-05-02 ENCOUNTER — Emergency Department (HOSPITAL_BASED_OUTPATIENT_CLINIC_OR_DEPARTMENT_OTHER)
Admission: EM | Admit: 2012-05-02 | Discharge: 2012-05-03 | Disposition: A | Discharge status: Home / Self Care | Payer: BC Managed Care – PPO | Attending: Emergency Medicine | Admitting: Emergency Medicine

## 2012-05-02 ENCOUNTER — Encounter (HOSPITAL_BASED_OUTPATIENT_CLINIC_OR_DEPARTMENT_OTHER): Payer: Self-pay | Admitting: *Deleted

## 2012-05-02 DIAGNOSIS — Z85819 Personal history of malignant neoplasm of unspecified site of lip, oral cavity, and pharynx: Secondary | ICD-10-CM | POA: Insufficient documentation

## 2012-05-02 DIAGNOSIS — R197 Diarrhea, unspecified: Secondary | ICD-10-CM | POA: Insufficient documentation

## 2012-05-02 DIAGNOSIS — Z9089 Acquired absence of other organs: Secondary | ICD-10-CM | POA: Insufficient documentation

## 2012-05-02 DIAGNOSIS — R509 Fever, unspecified: Secondary | ICD-10-CM | POA: Insufficient documentation

## 2012-05-02 DIAGNOSIS — R112 Nausea with vomiting, unspecified: Secondary | ICD-10-CM | POA: Insufficient documentation

## 2012-05-02 DIAGNOSIS — R52 Pain, unspecified: Secondary | ICD-10-CM | POA: Insufficient documentation

## 2012-05-02 LAB — COMPREHENSIVE METABOLIC PANEL
AST: 20 U/L (ref 0–37)
CO2: 25 mEq/L (ref 19–32)
Calcium: 9.7 mg/dL (ref 8.4–10.5)
Creatinine, Ser: 1.4 mg/dL — ABNORMAL HIGH (ref 0.50–1.35)
GFR calc Af Amer: 69 mL/min — ABNORMAL LOW (ref >90)
GFR calc non Af Amer: 60 mL/min — ABNORMAL LOW (ref >90)
Total Protein: 8.3 g/dL (ref 6.0–8.3)

## 2012-05-02 LAB — CBC WITH DIFFERENTIAL/PLATELET
Basophils Absolute: 0 10*3/uL (ref 0.0–0.1)
Eosinophils Absolute: 0 10*3/uL (ref 0.0–0.7)
Eosinophils Relative: 0 % (ref 0–5)
HCT: 46.4 % (ref 39.0–52.0)
Lymphocytes Relative: 8 % — ABNORMAL LOW (ref 12–46)
MCH: 28.1 pg (ref 26.0–34.0)
MCHC: 34.5 g/dL (ref 30.0–36.0)
MCV: 81.5 fL (ref 78.0–100.0)
Monocytes Absolute: 1.1 10*3/uL — ABNORMAL HIGH (ref 0.1–1.0)
Platelets: 285 10*3/uL (ref 150–400)
RDW: 12.7 % (ref 11.5–15.5)
WBC: 14.4 10*3/uL — ABNORMAL HIGH (ref 4.0–10.5)

## 2012-05-02 MED ORDER — PROMETHAZINE HCL 25 MG/ML IJ SOLN
25.0000 mg | Freq: Once | INTRAMUSCULAR | Status: AC
  Administered 2012-05-02: 25 mg via INTRAVENOUS
  Filled 2012-05-02: qty 1

## 2012-05-02 MED ORDER — ONDANSETRON HCL 4 MG/2ML IJ SOLN
4.0000 mg | Freq: Once | INTRAMUSCULAR | Status: AC
  Administered 2012-05-02: 4 mg via INTRAVENOUS
  Filled 2012-05-02: qty 2

## 2012-05-02 MED ORDER — OXYCODONE-ACETAMINOPHEN 5-325 MG PO TABS: 1.0000 | ORAL_TABLET | ORAL | Status: DC | PRN

## 2012-05-02 MED ORDER — ONDANSETRON 8 MG PO TBDP: 8.0000 mg | ORAL_TABLET | Freq: Three times a day (TID) | ORAL | Status: DC | PRN

## 2012-05-02 MED ORDER — HYDROMORPHONE HCL PF 1 MG/ML IJ SOLN
1.0000 mg | Freq: Once | INTRAMUSCULAR | Status: AC
  Administered 2012-05-02: 1 mg via INTRAVENOUS
  Filled 2012-05-02: qty 1

## 2012-05-02 MED ORDER — SODIUM CHLORIDE 0.9 % IV BOLUS (SEPSIS)
1000.0000 mL | Freq: Once | INTRAVENOUS | Status: AC
  Administered 2012-05-02: 1000 mL via INTRAVENOUS

## 2012-05-02 MED ORDER — ACETAMINOPHEN 325 MG PO TABS
650.0000 mg | ORAL_TABLET | Freq: Once | ORAL | Status: AC
  Administered 2012-05-02: 650 mg via ORAL
  Filled 2012-05-02: qty 2

## 2012-05-02 NOTE — ED Notes (Signed)
Pt c/o body pain x 1 day- vomited x 10, diarrhea x 10- pt on amoxicillin last week for sinus infection

## 2012-05-02 NOTE — ED Provider Notes (Signed)
History  This chart was scribed for Hilario Quarry, MD, by Candelaria Stagers, ED Scribe. This patient was seen in room MH12/MH12 and the patient's care was started at 9:45 PM   CSN: [id number]  Arrival date & time 05/02/12  1831   First MD Initiated Contact with Patient 05/02/12 2139      Chief Complaint  Patient presents with  . Emesis  . Generalized Body Aches     The history is provided by the patient. No language interpreter was used.   Duane Jackson is a 47 y.o. male who presents to the Emergency Department complaining of nausea, vomiting, diarrhea, and fever that started yesterday.  His fever in the ED is 100.3.  He reports vomiting 10-15 times, diarrhea 10-15 times.  Pt denies blood in vomit or stool.  Pt is also experiencing generalized body aches.  Pt has h/o cholecystectomy.  He has taken ibuprofen with no relief.  He denies ill contacts.     Past Medical History  Diagnosis Date  . S/P radiation therapy 05/22/99, 05/25/99, 05/28/99    Posterior Neck, Keloid, 1050 cGy in 3 fractions  . Cancer 03/04/11    mucoepidermoid carcinoma, L parotid    Past Surgical History  Procedure Laterality Date  . Cholecystectomy  2006    lap choli  . Keloid excision  2001    scalp  . Mass excision  03/04/2011    Procedure: EXCISION MASS;  Surgeon: Rosalio Macadamia;  Location: [facility];  Service: Plastics;  Laterality: Left;  excision mass left face     No family history on file.  History  Substance Use Topics  . Smoking status: Never Smoker   . Smokeless tobacco: Never Used  . Alcohol Use: 1.2 oz/week    2 Cans of beer per week      Review of Systems  Constitutional: Positive for fever.  Gastrointestinal: Positive for nausea, vomiting and diarrhea.  All other systems reviewed and are negative.    Allergies  Codeine  Home Medications   Current Outpatient Rx  Name  Route  Sig  Dispense  Refill  . acetaminophen (TYLENOL) 325 MG tablet   Oral   Take 650 mg  by mouth every 6 (six) hours as needed.           BP 127/77  Pulse 110  Temp(Src) 100.3 F (37.9 C) (Oral)  Resp 24  Ht 5\' 8"  (1.727 m)  Wt 195 lb (88.451 kg)  BMI 29.66 kg/m2  SpO2 100%  Physical Exam  Nursing note and vitals reviewed. Constitutional: He is oriented to person, place, and time. He appears well-developed and well-nourished. No distress.  HENT:  Head: Normocephalic and atraumatic.  Eyes: EOM are normal.  Neck: Neck supple. No tracheal deviation present.  Cardiovascular: Normal rate and regular rhythm.   Heart rate 90.   Pulmonary/Chest: Effort normal. No respiratory distress.  Abdominal: Soft. There is tenderness.  Musculoskeletal: Normal range of motion.  Neurological: He is alert and oriented to person, place, and time.  Skin: Skin is warm and dry.  Psychiatric: He has a normal mood and affect. His behavior is normal.    ED Course  Procedures   DIAGNOSTIC STUDIES: Oxygen Saturation is 100% on room air, normal by my interpretation.    COORDINATION OF CARE:  9:48 PM Discussed course of care with pt which includes fluids.  Pt understands and agrees.    Labs Reviewed  CBC WITH DIFFERENTIAL - Abnormal; Notable for  the following:    WBC 14.4 (*)    Neutrophils Relative 85 (*)    Neutro Abs 12.2 (*)    Lymphocytes Relative 8 (*)    Monocytes Absolute 1.1 (*)    All other components within normal limits  COMPREHENSIVE METABOLIC PANEL - Abnormal; Notable for the following:    Potassium 3.4 (*)    Glucose, Bld 116 (*)    Creatinine, Ser 1.40 (*)    GFR calc non Af Amer 60 (*)    GFR calc Af Amer 69 (*)    All other components within normal limits   No results found.   No diagnosis found.    MDM  I personally performed the services described in this documentation, which was scribed in my presence. The recorded information has been reviewed and considered.       Hilario Quarry, MD 05/03/12 [phone]

## 2012-05-03 NOTE — ED Notes (Signed)
Rx x 2 given for zofran and percocet- d/c home with family to drive- pt ambulatory at d/c

## 2016-03-22 ENCOUNTER — Encounter (HOSPITAL_BASED_OUTPATIENT_CLINIC_OR_DEPARTMENT_OTHER): Payer: Self-pay | Admitting: *Deleted

## 2016-03-22 ENCOUNTER — Emergency Department (HOSPITAL_BASED_OUTPATIENT_CLINIC_OR_DEPARTMENT_OTHER)
Admission: EM | Admit: 2016-03-22 | Discharge: 2016-03-23 | Disposition: A | Discharge status: Home / Self Care | Payer: 59 | Attending: Emergency Medicine | Admitting: Emergency Medicine

## 2016-03-22 DIAGNOSIS — Z85858 Personal history of malignant neoplasm of other endocrine glands: Secondary | ICD-10-CM | POA: Diagnosis not present

## 2016-03-22 DIAGNOSIS — R197 Diarrhea, unspecified: Secondary | ICD-10-CM | POA: Diagnosis not present

## 2016-03-22 DIAGNOSIS — R1030 Lower abdominal pain, unspecified: Secondary | ICD-10-CM | POA: Diagnosis not present

## 2016-03-22 DIAGNOSIS — R69 Illness, unspecified: Secondary | ICD-10-CM

## 2016-03-22 DIAGNOSIS — R05 Cough: Secondary | ICD-10-CM | POA: Insufficient documentation

## 2016-03-22 DIAGNOSIS — J3489 Other specified disorders of nose and nasal sinuses: Secondary | ICD-10-CM | POA: Diagnosis not present

## 2016-03-22 DIAGNOSIS — R509 Fever, unspecified: Secondary | ICD-10-CM | POA: Insufficient documentation

## 2016-03-22 DIAGNOSIS — R531 Weakness: Secondary | ICD-10-CM | POA: Diagnosis not present

## 2016-03-22 DIAGNOSIS — R112 Nausea with vomiting, unspecified: Secondary | ICD-10-CM | POA: Insufficient documentation

## 2016-03-22 DIAGNOSIS — M791 Myalgia: Secondary | ICD-10-CM | POA: Diagnosis not present

## 2016-03-22 DIAGNOSIS — R0981 Nasal congestion: Secondary | ICD-10-CM | POA: Insufficient documentation

## 2016-03-22 DIAGNOSIS — J111 Influenza due to unidentified influenza virus with other respiratory manifestations: Secondary | ICD-10-CM

## 2016-03-22 DIAGNOSIS — R109 Unspecified abdominal pain: Secondary | ICD-10-CM | POA: Diagnosis present

## 2016-03-22 LAB — CBC WITH DIFFERENTIAL/PLATELET
BASOS ABS: 0 10*3/uL (ref 0.0–0.1)
BASOS PCT: 0 %
EOS PCT: 0 %
Eosinophils Absolute: 0 10*3/uL (ref 0.0–0.7)
HEMATOCRIT: 45.5 % (ref 39.0–52.0)
Hemoglobin: 15.4 g/dL (ref 13.0–17.0)
Lymphocytes Relative: 8 %
Lymphs Abs: 0.9 10*3/uL (ref 0.7–4.0)
MCH: 27.6 pg (ref 26.0–34.0)
MCHC: 33.8 g/dL (ref 30.0–36.0)
MCV: 81.7 fL (ref 78.0–100.0)
MONO ABS: 1.2 10*3/uL — AB (ref 0.1–1.0)
Monocytes Relative: 11 %
Neutro Abs: 8.8 10*3/uL — ABNORMAL HIGH (ref 1.7–7.7)
Neutrophils Relative %: 81 %
PLATELETS: 241 10*3/uL (ref 150–400)
RBC: 5.57 MIL/uL (ref 4.22–5.81)
RDW: 13 % (ref 11.5–15.5)
WBC: 10.9 10*3/uL — ABNORMAL HIGH (ref 4.0–10.5)

## 2016-03-22 LAB — BASIC METABOLIC PANEL
ANION GAP: 8 (ref 5–15)
BUN: 12 mg/dL (ref 6–20)
CALCIUM: 8.9 mg/dL (ref 8.9–10.3)
CO2: 22 mmol/L (ref 22–32)
Chloride: 103 mmol/L (ref 101–111)
Creatinine, Ser: 1.09 mg/dL (ref 0.61–1.24)
Glucose, Bld: 105 mg/dL — ABNORMAL HIGH (ref 65–99)
Potassium: 3.3 mmol/L — ABNORMAL LOW (ref 3.5–5.1)
Sodium: 133 mmol/L — ABNORMAL LOW (ref 135–145)

## 2016-03-22 LAB — AMYLASE: AMYLASE: 74 U/L (ref 28–100)

## 2016-03-22 LAB — I-STAT CG4 LACTIC ACID, ED: LACTIC ACID, VENOUS: 0.65 mmol/L (ref 0.5–1.9)

## 2016-03-22 MED ORDER — SODIUM CHLORIDE 0.9 % IV BOLUS (SEPSIS)
1000.0000 mL | Freq: Once | INTRAVENOUS | Status: AC
  Administered 2016-03-23: 1000 mL via INTRAVENOUS

## 2016-03-22 MED ORDER — ACETAMINOPHEN 500 MG PO TABS
1000.0000 mg | ORAL_TABLET | Freq: Once | ORAL | Status: AC
  Administered 2016-03-22: 1000 mg via ORAL
  Filled 2016-03-22: qty 2

## 2016-03-22 NOTE — ED Provider Notes (Signed)
[facility] DEPT Provider Note   CSN: [id number] Arrival date & time: 03/22/16  2237  By signing my name below, I, Jaquelyn Bitter., attest that this documentation has been prepared under the direction and in the presence of No att. providers found. Electronically signed: Jaquelyn Bitter., ED Scribe. 03/25/16. 9:39 AM.    History   Chief Complaint Chief Complaint  Patient presents with  . Fever    HPI  Duane Jackson is a 51 y.o. male who presents to the Emergency Department complaining of mild to moderate flu-like symptoms with sudden onset x2 days. Pt states that he has had fever and abdominal pain all day. Of note, pt states that x2 weeks ago he got over the flu but yesterday the symptoms returned. He states that the symptoms started with abdominal pain. Next, pt developed nausea, vomiting x2-3 and diarrhea x4-5 which was watery, myalgia, weakness, rhinorrhea, congestion. Pt has taken Tylenol with mild relief. He denies any modifying actors. Pt denies blood in stool, appetite change, hematuria, dysuria. Pt has hx of cholecystomy. The history is provided by the patient. No language interpreter was used.    Past Medical History:  Diagnosis Date  . Cancer ([place]) 03/04/11   mucoepidermoid carcinoma, L parotid  . S/P radiation therapy 05/22/99, 05/25/99, 05/28/99   Posterior Neck, Keloid, 1050 cGy in 3 fractions    Patient Active Problem List   Diagnosis Date Noted  . Cancer of parotid gland ([place]) 03/15/2011  . S/P radiation therapy   . Cancer ([place]) 03/04/2011    Past Surgical History:  Procedure Laterality Date  . CHOLECYSTECTOMY  2006   lap choli  . KELOID EXCISION  2001   scalp  . MASS EXCISION  03/04/2011   Procedure: EXCISION MASS;  Surgeon: Hermelinda Dellen;  Location: [facility];  Service: Plastics;  Laterality: Left;  excision mass left face        Home Medications    Prior to Admission medications   Medication Sig Start Date  End Date Taking? Authorizing Provider  acetaminophen (TYLENOL) 325 MG tablet Take 2 tablets (650 mg total) by mouth every 6 (six) hours as needed. 03/23/16   Varney Biles, MD  ondansetron (ZOFRAN ODT) 8 MG disintegrating tablet Take 1 tablet (8 mg total) by mouth every 8 (eight) hours as needed for nausea. 03/23/16   Varney Biles, MD  oxyCODONE-acetaminophen (PERCOCET/ROXICET) 5-325 MG per tablet Take 1 tablet by mouth every 4 (four) hours as needed for pain. 05/02/12   Pattricia Boss, MD    Family History No family history on file.  Social History Social History  Substance Use Topics  . Smoking status: Never Smoker  . Smokeless tobacco: Never Used  . Alcohol use 1.2 oz/week    2 Cans of beer per week     Allergies   Codeine   Review of Systems Review of Systems  Constitutional: Positive for fever. Negative for appetite change.  HENT: Positive for congestion and rhinorrhea.   Respiratory: Positive for cough.   Gastrointestinal: Positive for abdominal pain, diarrhea, nausea and vomiting. Negative for blood in stool.  Genitourinary: Negative for dysuria and hematuria.  Musculoskeletal: Positive for myalgias.  Neurological: Positive for weakness.     Physical Exam Updated Vital Signs BP 147/61   Pulse 81   Temp 98.7 F (37.1 C) (Oral)   Resp 20   Ht 5' 6.5" (1.689 m)   Wt 195 lb (88.5 kg)   SpO2 98%  BMI 31.00 kg/m   Physical Exam  Constitutional: He appears well-developed and well-nourished.  HENT:  Head: Normocephalic and atraumatic.  Eyes: Conjunctivae are normal.  Neck: Neck supple.  Cardiovascular: Normal rate and regular rhythm.   No murmur heard. Pulmonary/Chest: Effort normal and breath sounds normal. No respiratory distress.  Lungs clear  Abdominal: Soft. There is no tenderness.  Musculoskeletal: He exhibits no edema.  Neurological: He is alert.  Skin: Skin is warm and dry.  Psychiatric: He has a normal mood and affect.  Nursing note and vitals  reviewed.    ED Treatments / Results  Labs (all labs ordered are listed, but only abnormal results are displayed) Labs Reviewed  CBC WITH DIFFERENTIAL/PLATELET - Abnormal; Notable for the following:       Result Value   WBC 10.9 (*)    Neutro Abs 8.8 (*)    Monocytes Absolute 1.2 (*)    All other components within normal limits  BASIC METABOLIC PANEL - Abnormal; Notable for the following:    Sodium 133 (*)    Potassium 3.3 (*)    Glucose, Bld 105 (*)    All other components within normal limits  AMYLASE  URINALYSIS, ROUTINE W REFLEX MICROSCOPIC  I-STAT CG4 LACTIC ACID, ED    EKG  EKG Interpretation None       Radiology No results found.  Procedures Procedures (including critical care time)  Medications Ordered in ED Medications  acetaminophen (TYLENOL) tablet 1,000 mg (1,000 mg Oral Given 03/22/16 2252)  sodium chloride 0.9 % bolus 1,000 mL (0 mLs Intravenous Stopped 03/23/16 0157)  ondansetron (ZOFRAN) injection 4 mg (4 mg Intravenous Given 03/23/16 0013)     Initial Impression / Assessment and Plan / ED Course  I have reviewed the triage vital signs and the nursing notes.  Pertinent labs & imaging results that were available during my care of the patient were reviewed by me and considered in my medical decision making (see chart for details).     I personally performed the services described in this documentation, which was scribed in my presence. The recorded information has been reviewed and is accurate.  Pt appears to be having flu like illness. She is immunocompetent and vitals are reassuring. Pt also has abd pain - but the pain has been present for several days and there is no peritoneal findings, so I doubt there is acute abdominal process.  Final Clinical Impressions(s) / ED Diagnoses   Final diagnoses:  Influenza-like illness  Lower abdominal pain    New Prescriptions Discharge Medication List as of 03/23/2016  2:53 AM       Varney Biles,  MD 03/25/16 [phone]

## 2016-03-22 NOTE — ED Notes (Signed)
Pt was given a urinal. Pt states he would try at this time.

## 2016-03-22 NOTE — ED Triage Notes (Signed)
Fever today. Flu last week. He took Adult nurse. He was getting better. He has not had Tylenol or Motrin for fever. He is moaning at triage. Says his stomach hurts.

## 2016-03-23 LAB — URINALYSIS, ROUTINE W REFLEX MICROSCOPIC
Bilirubin Urine: NEGATIVE
GLUCOSE, UA: NEGATIVE mg/dL
Hgb urine dipstick: NEGATIVE
KETONES UR: NEGATIVE mg/dL
Leukocytes, UA: NEGATIVE
Nitrite: NEGATIVE
PH: 6 (ref 5.0–8.0)
Protein, ur: NEGATIVE mg/dL
Specific Gravity, Urine: 1.014 (ref 1.005–1.030)

## 2016-03-23 MED ORDER — ONDANSETRON 8 MG PO TBDP: 8.0000 mg | ORAL_TABLET | Freq: Three times a day (TID) | ORAL | 0 refills | Status: DC | PRN

## 2016-03-23 MED ORDER — ONDANSETRON HCL 4 MG/2ML IJ SOLN
4.0000 mg | Freq: Once | INTRAMUSCULAR | Status: AC
  Administered 2016-03-23: 4 mg via INTRAVENOUS
  Filled 2016-03-23: qty 2

## 2016-03-23 MED ORDER — ACETAMINOPHEN 325 MG PO TABS: 650.0000 mg | ORAL_TABLET | Freq: Four times a day (QID) | ORAL | 0 refills | Status: DC | PRN

## 2016-03-23 NOTE — Discharge Instructions (Signed)
°  All the results in the ER are overall reassuring. You do appear ill - and we suspect flu like illness.  Please return to the ER if your symptoms worsen; you have increased pain, fevers, chills, inability to keep any medications down, confusion, bloody stools. Otherwise see the outpatient doctor as requested.

## 2016-03-23 NOTE — ED Notes (Signed)
Patient drinking ginger ale and tolerating well.

## 2017-04-29 ENCOUNTER — Other Ambulatory Visit: Payer: Self-pay | Admitting: Endodontics

## 2019-07-13 ENCOUNTER — Emergency Department (HOSPITAL_COMMUNITY): Payer: BLUE CROSS/BLUE SHIELD

## 2019-07-13 ENCOUNTER — Other Ambulatory Visit: Payer: Self-pay

## 2019-07-13 ENCOUNTER — Encounter (HOSPITAL_COMMUNITY): Payer: Self-pay | Admitting: Emergency Medicine

## 2019-07-13 ENCOUNTER — Inpatient Hospital Stay (HOSPITAL_COMMUNITY)
Admission: EM | Admit: 2019-07-13 | Discharge: 2019-07-17 | DRG: 177 | Disposition: A | Discharge status: Home / Self Care | Payer: BLUE CROSS/BLUE SHIELD | Attending: Internal Medicine | Admitting: Internal Medicine

## 2019-07-13 DIAGNOSIS — J1282 Pneumonia due to coronavirus disease 2019: Secondary | ICD-10-CM | POA: Diagnosis present

## 2019-07-13 DIAGNOSIS — Z885 Allergy status to narcotic agent status: Secondary | ICD-10-CM

## 2019-07-13 DIAGNOSIS — E872 Acidosis, unspecified: Secondary | ICD-10-CM

## 2019-07-13 DIAGNOSIS — Z85818 Personal history of malignant neoplasm of other sites of lip, oral cavity, and pharynx: Secondary | ICD-10-CM

## 2019-07-13 DIAGNOSIS — Z7289 Other problems related to lifestyle: Secondary | ICD-10-CM

## 2019-07-13 DIAGNOSIS — R0600 Dyspnea, unspecified: Secondary | ICD-10-CM | POA: Diagnosis not present

## 2019-07-13 DIAGNOSIS — Z9049 Acquired absence of other specified parts of digestive tract: Secondary | ICD-10-CM

## 2019-07-13 DIAGNOSIS — Z79899 Other long term (current) drug therapy: Secondary | ICD-10-CM

## 2019-07-13 DIAGNOSIS — E876 Hypokalemia: Secondary | ICD-10-CM | POA: Diagnosis present

## 2019-07-13 DIAGNOSIS — U071 COVID-19: Secondary | ICD-10-CM | POA: Diagnosis not present

## 2019-07-13 DIAGNOSIS — C07 Malignant neoplasm of parotid gland: Secondary | ICD-10-CM | POA: Diagnosis present

## 2019-07-13 LAB — COMPREHENSIVE METABOLIC PANEL
ALT: 33 U/L (ref 0–44)
AST: 38 U/L (ref 15–41)
Albumin: 3.3 g/dL — ABNORMAL LOW (ref 3.5–5.0)
Alkaline Phosphatase: 42 U/L (ref 38–126)
Anion gap: 7 (ref 5–15)
BUN: 12 mg/dL (ref 6–20)
CO2: 25 mmol/L (ref 22–32)
Calcium: 7.9 mg/dL — ABNORMAL LOW (ref 8.9–10.3)
Chloride: 101 mmol/L (ref 98–111)
Creatinine, Ser: 1.06 mg/dL (ref 0.61–1.24)
GFR calc Af Amer: >60 mL/min (ref >60)
GFR calc non Af Amer: >60 mL/min (ref >60)
Glucose, Bld: 114 mg/dL — ABNORMAL HIGH (ref 70–99)
Potassium: 3.7 mmol/L (ref 3.5–5.1)
Sodium: 133 mmol/L — ABNORMAL LOW (ref 135–145)
Total Bilirubin: 0.6 mg/dL (ref 0.3–1.2)
Total Protein: 6.8 g/dL (ref 6.5–8.1)

## 2019-07-13 LAB — LACTIC ACID, PLASMA: Lactic Acid, Venous: 1.6 mmol/L (ref 0.5–1.9)

## 2019-07-13 LAB — TRIGLYCERIDES: Triglycerides: 87 mg/dL (ref <150)

## 2019-07-13 LAB — CBC WITH DIFFERENTIAL/PLATELET
Abs Immature Granulocytes: 0.02 10*3/uL (ref 0.00–0.07)
Basophils Absolute: 0 10*3/uL (ref 0.0–0.1)
Basophils Relative: 0 %
Eosinophils Absolute: 0 10*3/uL (ref 0.0–0.5)
Eosinophils Relative: 0 %
HCT: 45.7 % (ref 39.0–52.0)
Hemoglobin: 14.9 g/dL (ref 13.0–17.0)
Immature Granulocytes: 1 %
Lymphocytes Relative: 20 %
Lymphs Abs: 0.9 10*3/uL (ref 0.7–4.0)
MCH: 27.9 pg (ref 26.0–34.0)
MCHC: 32.6 g/dL (ref 30.0–36.0)
MCV: 85.6 fL (ref 80.0–100.0)
Monocytes Absolute: 0.4 10*3/uL (ref 0.1–1.0)
Monocytes Relative: 8 %
Neutro Abs: 3 10*3/uL (ref 1.7–7.7)
Neutrophils Relative %: 71 %
Platelets: 151 10*3/uL (ref 150–400)
RBC: 5.34 MIL/uL (ref 4.22–5.81)
RDW: 12.7 % (ref 11.5–15.5)
WBC: 4.3 10*3/uL (ref 4.0–10.5)
nRBC: 0 % (ref 0.0–0.2)

## 2019-07-13 LAB — PROCALCITONIN: Procalcitonin: <0.1 ng/mL

## 2019-07-13 LAB — URINALYSIS, ROUTINE W REFLEX MICROSCOPIC
Bacteria, UA: NONE SEEN
Bilirubin Urine: NEGATIVE
Glucose, UA: NEGATIVE mg/dL
Hgb urine dipstick: NEGATIVE
Ketones, ur: NEGATIVE mg/dL
Leukocytes,Ua: NEGATIVE
Nitrite: NEGATIVE
Protein, ur: 100 mg/dL — AB
Specific Gravity, Urine: 1.027 (ref 1.005–1.030)
pH: 5 (ref 5.0–8.0)

## 2019-07-13 LAB — D-DIMER, QUANTITATIVE: D-Dimer, Quant: 0.35 ug/mL-FEU (ref 0.00–0.50)

## 2019-07-13 LAB — PROTIME-INR
INR: 1 (ref 0.8–1.2)
Prothrombin Time: 12.7 seconds (ref 11.4–15.2)

## 2019-07-13 LAB — FERRITIN: Ferritin: 423 ng/mL — ABNORMAL HIGH (ref 24–336)

## 2019-07-13 LAB — C-REACTIVE PROTEIN: CRP: 8 mg/dL — ABNORMAL HIGH (ref <1.0)

## 2019-07-13 LAB — LACTATE DEHYDROGENASE: LDH: 188 U/L (ref 98–192)

## 2019-07-13 LAB — SARS CORONAVIRUS 2 BY RT PCR (HOSPITAL ORDER, PERFORMED IN ~~LOC~~ HOSPITAL LAB): SARS Coronavirus 2: POSITIVE — AB

## 2019-07-13 LAB — FIBRINOGEN: Fibrinogen: 436 mg/dL (ref 210–475)

## 2019-07-13 MED ORDER — IPRATROPIUM-ALBUTEROL 0.5-2.5 (3) MG/3ML IN SOLN: 3.0000 mL | Freq: Four times a day (QID) | RESPIRATORY_TRACT | Status: DC | PRN

## 2019-07-13 MED ORDER — ONDANSETRON HCL 4 MG PO TABS: 4.0000 mg | ORAL_TABLET | Freq: Four times a day (QID) | ORAL | Status: DC | PRN

## 2019-07-13 MED ORDER — ACETAMINOPHEN 650 MG RE SUPP: 650.0000 mg | Freq: Four times a day (QID) | RECTAL | Status: DC | PRN

## 2019-07-13 MED ORDER — SODIUM CHLORIDE 0.9 % IV SOLN
100.0000 mg | INTRAVENOUS | Status: AC
  Administered 2019-07-13 – 2019-07-14 (×2): 100 mg via INTRAVENOUS
  Filled 2019-07-13 (×2): qty 20

## 2019-07-13 MED ORDER — ACETAMINOPHEN 325 MG PO TABS
650.0000 mg | ORAL_TABLET | Freq: Once | ORAL | Status: AC
  Administered 2019-07-13: 650 mg via ORAL
  Filled 2019-07-13: qty 2

## 2019-07-13 MED ORDER — SODIUM CHLORIDE 0.9 % IV SOLN
1000.0000 mL | INTRAVENOUS | Status: DC
  Administered 2019-07-13: 1000 mL via INTRAVENOUS

## 2019-07-13 MED ORDER — HEPARIN SODIUM (PORCINE) 5000 UNIT/ML IJ SOLN
5000.0000 [IU] | Freq: Three times a day (TID) | INTRAMUSCULAR | Status: DC
  Administered 2019-07-14 – 2019-07-15 (×5): 5000 [IU] via SUBCUTANEOUS
  Filled 2019-07-13 (×6): qty 1

## 2019-07-13 MED ORDER — ACETAMINOPHEN 325 MG PO TABS
650.0000 mg | ORAL_TABLET | Freq: Four times a day (QID) | ORAL | Status: DC | PRN
  Administered 2019-07-14 – 2019-07-16 (×4): 650 mg via ORAL
  Filled 2019-07-13 (×4): qty 2

## 2019-07-13 MED ORDER — SODIUM CHLORIDE 0.9 % IV SOLN
100.0000 mg | Freq: Every day | INTRAVENOUS | Status: AC
  Administered 2019-07-14 – 2019-07-17 (×4): 100 mg via INTRAVENOUS
  Filled 2019-07-13 (×4): qty 20

## 2019-07-13 MED ORDER — ONDANSETRON HCL 4 MG/2ML IJ SOLN: 4.0000 mg | Freq: Four times a day (QID) | INTRAMUSCULAR | Status: DC | PRN

## 2019-07-13 NOTE — H&P (Addendum)
History and Physical    Duane Jackson [id number] DOB: [birth year] DOA: 07/13/2019  PCP: Hulan Fess, MD   Patient coming from: Home   I have personally briefly reviewed patient's old medical records in [place]  Chief Complaint: Worsening shortness of breath  HPI: Duane Jackson is a 54 y.o. 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   Patient reports being diagnosed with Covid 07/06/2019 after developed fever chills and myalgias.  Since then he has been quarantining at home.  Lives with her 18 year old son.  Since yesterday he reports bouts of mostly dry cough with dyspnea.  No wheezing, chest pain, palpitations, leg swelling.  Denies any changes in taste or smell.  No diarrhea. Given worsening symptoms decided to come to the for further evaluation.  Personal social history: Patient is married with one 20 year old child.  Denies any smoking.  Social drinking.  Denies any illicit drug use.  Family history: Unremarkable per patient.   ED Course:  Vital signs with 145/75, RR 26, SPO2 100%.  T-max 102.2 -D-dimer 0.35 LDH 188, ferritin 423, fibrinogen 436, CRP 8 -Lactic acid 1.6 -WBC 4.3, lymphocytes 20% -Sodium 133, potassium 3.7, BUN 12, creatinine 1.07, albumin 3.3 -Procalcitonin<0.10  -Covid PCR positive -CXR with patchy nodular opacity in the lungs bilaterally likely infectious.  -Blood cultures pending  Review of Systems: As per HPI otherwise 10 point review of systems negative.    Past Medical History:  Diagnosis Date  . Cancer ([place]) 03/04/11   mucoepidermoid carcinoma, L parotid  . S/P radiation therapy 05/22/99, 05/25/99, 05/28/99   Posterior Neck, Keloid, 1050 cGy in 3 fractions    Past Surgical History:  Procedure Laterality Date  . CHOLECYSTECTOMY  2006   lap choli  . KELOID EXCISION  2001   scalp  . MASS EXCISION   03/04/2011   Procedure: EXCISION MASS;  Surgeon: Hermelinda Dellen;  Location: [facility];  Service: Plastics;  Laterality: Left;  excision mass left face      reports that he has never smoked. He has never used smokeless tobacco. He reports current alcohol use of about 2.0 standard drinks of alcohol per week. He reports that he does not use drugs.  Allergies  Allergen Reactions  . Codeine Nausea And Vomiting    No family history on file.  Review of Systems  Constitutional: Positive for chills, fever and malaise/fatigue. Negative for diaphoresis and weight loss.  HENT: Negative.   Eyes: Negative.   Respiratory: Positive for cough and shortness of breath. Negative for hemoptysis, sputum production and wheezing.   Cardiovascular: Negative for chest pain, palpitations, orthopnea, claudication, leg swelling and PND.  Gastrointestinal: Negative.   Genitourinary: Negative.   Musculoskeletal: Positive for myalgias. Negative for back pain, joint pain and neck pain.  Skin: Negative.   Neurological: Negative.   Endo/Heme/Allergies: Negative.   Psychiatric/Behavioral: Negative.     Prior to Admission medications   Medication Sig Start Date End Date Taking? Authorizing Provider  acetaminophen (TYLENOL) 325 MG tablet Take 2 tablets (650 mg total) by mouth every 6 (six) hours as needed. 03/23/16   Varney Biles, MD  ondansetron (ZOFRAN ODT) 8 MG disintegrating tablet Take 1 tablet (8 mg total) by mouth every 8 (eight) hours as needed for nausea. 03/23/16   Varney Biles, MD  oxyCODONE-acetaminophen (PERCOCET/ROXICET) 5-325 MG per tablet Take 1 tablet by mouth every 4 (four)  hours as needed for pain. 05/02/12   Pattricia Boss, MD    Physical Exam: Vitals:   07/13/19 1703 07/13/19 1800 07/13/19 1830 07/13/19 1900  BP: (!) 145/75 129/77 127/83 121/74  Pulse: 94 91 89 88  Resp: (!) 26 (!) 29 (!) 26 (!) 32  Temp: (!) 102.2 F (39 C)     TempSrc: Rectal     SpO2: 100% 96% 98%  97%  Weight:      Height:        Constitutional: NAD, calm, comfortable Vitals:   07/13/19 1703 07/13/19 1800 07/13/19 1830 07/13/19 1900  BP: (!) 145/75 129/77 127/83 121/74  Pulse: 94 91 89 88  Resp: (!) 26 (!) 29 (!) 26 (!) 32  Temp: (!) 102.2 F (39 C)     TempSrc: Rectal     SpO2: 100% 96% 98% 97%  Weight:      Height:       Physical Exam  Constitutional: He is oriented to person, place, and time. He appears well-developed and well-nourished. He appears distressed.  HENT:  Head: Normocephalic and atraumatic.  Mouth/Throat: Oropharynx is clear and moist. No oropharyngeal exudate.  Eyes: Pupils are equal, round, and reactive to light. EOM are normal. Right eye exhibits no discharge. Left eye exhibits no discharge. No scleral icterus.  Neck: No JVD present.  Cardiovascular: Normal rate, regular rhythm and normal heart sounds. Exam reveals no gallop and no friction rub.  No murmur heard. Pulmonary/Chest: He is in respiratory distress. He has no wheezes. He has rales. He exhibits no tenderness.  Abdominal: Soft. Bowel sounds are normal. He exhibits no distension and no mass. There is no abdominal tenderness. There is no rebound and no guarding.  Genitourinary:    Penis and rectum normal.  Rectum:     Guaiac result negative.  No penile tenderness.  Musculoskeletal:        General: No tenderness, deformity or edema. Normal range of motion.     Cervical back: Normal range of motion and neck supple.  Neurological: He is alert and oriented to person, place, and time.  Skin: Skin is warm and dry.  Psychiatric: He has a normal mood and affect. His behavior is normal. Judgment and thought content normal.  Vitals reviewed.   Labs on Admission: I have personally reviewed following labs and imaging studies  CBC: Recent Labs  Lab 07/13/19 1651  WBC 4.3  NEUTROABS 3.0  HGB 14.9  HCT 45.7  MCV 85.6  PLT [id number]   Basic Metabolic Panel: Recent Labs  Lab 07/13/19 1651  NA 133*    K 3.7  CL 101  CO2 25  GLUCOSE 114*  BUN 12  CREATININE 1.06  CALCIUM 7.9*   GFR: Estimated Creatinine Clearance: 89.1 mL/min (by C-G formula based on SCr of 1.06 mg/dL). Liver Function Tests: Recent Labs  Lab 07/13/19 1651  AST 38  ALT 33  ALKPHOS 42  BILITOT 0.6  PROT 6.8  ALBUMIN 3.3*   No results for input(s): LIPASE, AMYLASE in the last 168 hours. No results for input(s): AMMONIA in the last 168 hours. Coagulation Profile: Recent Labs  Lab 07/13/19 1651  INR 1.0   Cardiac Enzymes: No results for input(s): CKTOTAL, CKMB, CKMBINDEX, TROPONINI in the last 168 hours. BNP (last 3 results) No results for input(s): PROBNP in the last 8760 hours. HbA1C: No results for input(s): HGBA1C in the last 72 hours. CBG: No results for input(s): GLUCAP in the last 168 hours. Lipid Profile: Recent  Labs    07/13/19 1650  TRIG 87   Thyroid Function Tests: No results for input(s): TSH, T4TOTAL, FREET4, T3FREE, THYROIDAB in the last 72 hours. Anemia Panel: Recent Labs    07/13/19 1650  FERRITIN 423*   Urine analysis:    Component Value Date/Time   COLORURINE YELLOW 07/13/2019 1741   APPEARANCEUR CLEAR 07/13/2019 1741   LABSPEC 1.027 07/13/2019 1741   PHURINE 5.0 07/13/2019 1741   GLUCOSEU NEGATIVE 07/13/2019 1741   HGBUR NEGATIVE 07/13/2019 1741   BILIRUBINUR NEGATIVE 07/13/2019 1741   KETONESUR NEGATIVE 07/13/2019 1741   PROTEINUR 100 (A) 07/13/2019 1741   UROBILINOGEN 1.0 09/17/2009 0603   NITRITE NEGATIVE 07/13/2019 1741   LEUKOCYTESUR NEGATIVE 07/13/2019 1741    Radiological Exams on Admission: DG Chest Port 1 View  Result Date: 07/13/2019 CLINICAL DATA:  Cough and shortness of breath.  COVID positive. EXAM: PORTABLE CHEST 1 VIEW COMPARISON:  None. FINDINGS: 1726 hours. The cardiopericardial silhouette is within normal limits for size. Patchy/nodular opacities are seen in the lungs bilaterally with central predominance. No pleural effusion. No pneumothorax.  The visualized bony structures of the thorax are intact. Telemetry leads overlie the chest. IMPRESSION: Centrally predominant patchy/nodular opacity in the lungs bilaterally. Imaging features likely infectious given the reported history of COVID positive. Given the somewhat nodular appearance, close follow-up recommended to ensure resolution. Electronically Signed   By: Misty Stanley M.D.   On: 07/13/2019 17:41    EKG: Independently reviewed.  NSR at 93 bpm, normal axis, RSR slight ST elevation in right-sided precordial leads.  Assessment/Plan Active Problems:   * No active hospital problems. *   #Covid pneumonia without hypoxia D-dimer unremarkable, procalcitonin negative, ferritin 423, CRP 8, fibrinogen 436 -Pt admitted for observation.   -We will start on remdesivir. -Supportive care. -No indication Decadron since patient not on supplemental oxygen -No indication for IL-6 inhibitor since no risk factors for rapid clinical deterioration. -Consider repeating inflammatory markers if any clinical deterioration.  #SIRS from above.   DVT prophylaxis: Subcu heparin  code Status: Full code Family Communication: Self.   Disposition Plan: Home.   Consults called: None.  Admission status: observation    Edman Lipsey MD [facility]   If 7PM-7AM, please contact night-coverage [email or web address] Password [facility]  07/13/2019, 8:08 PM

## 2019-07-13 NOTE — ED Notes (Signed)
Labeled urine specimen and culture sent to lab. ENMiles 

## 2019-07-13 NOTE — ED Provider Notes (Signed)
[facility] DEPT Provider Note   CSN: [id number] Arrival date & time: 07/13/19  [id number]     History Chief Complaint  Patient presents with  . COVID +  . Shortness of Breath    Duane Jackson is a 54 y.o. male.  54 year old male presents complaining of cough and congestion.  Was diagnosed with Covid 5 days ago.  Since that time is had increasing dyspnea exertion as well productive cough.  Denies any vomiting or diarrhea.  No pleuritic chest pain.  No leg pain or swelling.  Symptoms became worse today when he tried to walk around and called EMS and was transported here        Past Medical History:  Diagnosis Date  . Cancer ([place]) 03/04/11   mucoepidermoid carcinoma, L parotid  . S/P radiation therapy 05/22/99, 05/25/99, 05/28/99   Posterior Neck, Keloid, 1050 cGy in 3 fractions    Patient Active Problem List   Diagnosis Date Noted  . Cancer of parotid gland ([place]) 03/15/2011  . S/P radiation therapy   . Cancer ([place]) 03/04/2011    Past Surgical History:  Procedure Laterality Date  . CHOLECYSTECTOMY  2006   lap choli  . KELOID EXCISION  2001   scalp  . MASS EXCISION  03/04/2011   Procedure: EXCISION MASS;  Surgeon: Hermelinda Dellen;  Location: [facility];  Service: Plastics;  Laterality: Left;  excision mass left face        No family history on file.  Social History   Tobacco Use  . Smoking status: Never Smoker  . Smokeless tobacco: Never Used  Substance Use Topics  . Alcohol use: Yes    Alcohol/week: 2.0 standard drinks    Types: 2 Cans of beer per week  . Drug use: No    Home Medications Prior to Admission medications   Medication Sig Start Date End Date Taking? Authorizing Provider  acetaminophen (TYLENOL) 325 MG tablet Take 2 tablets (650 mg total) by mouth every 6 (six) hours as needed. 03/23/16   Varney Biles, MD  ondansetron (ZOFRAN ODT) 8 MG disintegrating tablet Take 1 tablet (8 mg total) by mouth every 8  (eight) hours as needed for nausea. 03/23/16   Varney Biles, MD  oxyCODONE-acetaminophen (PERCOCET/ROXICET) 5-325 MG per tablet Take 1 tablet by mouth every 4 (four) hours as needed for pain. 05/02/12   Pattricia Boss, MD    Allergies    Codeine  Review of Systems   Review of Systems  All other systems reviewed and are negative.   Physical Exam Updated Vital Signs BP (!) 145/75 (BP Location: Right Arm)   Pulse 94   Temp (!) 102.2 F (39 C) (Rectal)   Resp (!) 26   Ht 1.727 m (5\' 8" )   Wt 88.5 kg   SpO2 100%   BMI 29.65 kg/m   Physical Exam Vitals and nursing note reviewed.  Constitutional:      General: He is not in acute distress.    Appearance: Normal appearance. He is well-developed. He is not toxic-appearing.  HENT:     Head: Normocephalic and atraumatic.  Eyes:     General: Lids are normal.     Conjunctiva/sclera: Conjunctivae normal.     Pupils: Pupils are equal, round, and reactive to light.  Neck:     Thyroid: No thyroid mass.     Trachea: No tracheal deviation.  Cardiovascular:     Rate and Rhythm: Normal rate and regular rhythm.  Heart sounds: Normal heart sounds. No murmur. No gallop.   Pulmonary:     Effort: Tachypnea and respiratory distress present.     Breath sounds: No stridor. Decreased breath sounds present. No wheezing, rhonchi or rales.  Abdominal:     General: Bowel sounds are normal. There is no distension.     Palpations: Abdomen is soft.     Tenderness: There is no abdominal tenderness. There is no rebound.  Musculoskeletal:        General: No tenderness. Normal range of motion.     Cervical back: Normal range of motion and neck supple.  Skin:    General: Skin is warm and dry.     Findings: No abrasion or rash.  Neurological:     Mental Status: He is alert and oriented to person, place, and time.     GCS: GCS eye subscore is 4. GCS verbal subscore is 5. GCS motor subscore is 6.     Cranial Nerves: No cranial nerve deficit.      Sensory: No sensory deficit.  Psychiatric:        Speech: Speech normal.        Behavior: Behavior normal.     ED Results / Procedures / Treatments   Labs (all labs ordered are listed, but only abnormal results are displayed) Labs Reviewed  CULTURE, BLOOD (ROUTINE X 2)  CULTURE, BLOOD (ROUTINE X 2)  SARS CORONAVIRUS 2 BY RT PCR (HOSPITAL ORDER, [facility])  COMPREHENSIVE METABOLIC PANEL  LACTIC ACID, PLASMA  LACTIC ACID, PLASMA  CBC WITH DIFFERENTIAL/PLATELET  PROTIME-INR  URINALYSIS, ROUTINE W REFLEX MICROSCOPIC  D-DIMER, QUANTITATIVE (NOT AT [facility])  PROCALCITONIN  LACTATE DEHYDROGENASE  FERRITIN  TRIGLYCERIDES  FIBRINOGEN  C-REACTIVE PROTEIN    EKG EKG Interpretation  Date/Time:  Tuesday Jul 13 2019 17:40:28 EDT Ventricular Rate:  93 PR Interval:    QRS Duration: 93 QT Interval:  331 QTC Calculation: 412 R Axis:   30 Text Interpretation: Sinus rhythm RSR' in V1 or V2, right VCD or RVH Confirmed by Lacretia Leigh ([id number]) on 07/13/2019 7:53:51 PM   Radiology No results found.  Procedures Procedures (including critical care time)  Medications Ordered in ED Medications  0.9 %  sodium chloride infusion (has no administration in time range)    ED Course  I have reviewed the triage vital signs and the nursing notes.  Pertinent labs & imaging results that were available during my care of the patient were reviewed by me and considered in my medical decision making (see chart for details).    MDM Rules/Calculators/A&P                      Patient's x-ray consistent with Covid infection that is already known.  Patient's fever treated with Tylenol.  Will admit to the hospitalist service Final Clinical Impression(s) / ED Diagnoses Final diagnoses:  None    Rx / DC Orders ED Discharge Orders    None       Lacretia Leigh, MD 07/13/19 1954

## 2019-07-14 ENCOUNTER — Encounter (HOSPITAL_COMMUNITY): Payer: Self-pay | Admitting: Internal Medicine

## 2019-07-14 DIAGNOSIS — Z85818 Personal history of malignant neoplasm of other sites of lip, oral cavity, and pharynx: Secondary | ICD-10-CM | POA: Diagnosis not present

## 2019-07-14 DIAGNOSIS — U071 COVID-19: Secondary | ICD-10-CM | POA: Diagnosis present

## 2019-07-14 DIAGNOSIS — J1282 Pneumonia due to coronavirus disease 2019: Secondary | ICD-10-CM | POA: Diagnosis present

## 2019-07-14 DIAGNOSIS — Z7289 Other problems related to lifestyle: Secondary | ICD-10-CM | POA: Diagnosis not present

## 2019-07-14 DIAGNOSIS — E872 Acidosis: Secondary | ICD-10-CM | POA: Diagnosis present

## 2019-07-14 DIAGNOSIS — Z9049 Acquired absence of other specified parts of digestive tract: Secondary | ICD-10-CM | POA: Diagnosis not present

## 2019-07-14 DIAGNOSIS — Z79899 Other long term (current) drug therapy: Secondary | ICD-10-CM | POA: Diagnosis not present

## 2019-07-14 DIAGNOSIS — C07 Malignant neoplasm of parotid gland: Secondary | ICD-10-CM | POA: Diagnosis not present

## 2019-07-14 DIAGNOSIS — Z885 Allergy status to narcotic agent status: Secondary | ICD-10-CM | POA: Diagnosis not present

## 2019-07-14 DIAGNOSIS — E876 Hypokalemia: Secondary | ICD-10-CM | POA: Diagnosis present

## 2019-07-14 DIAGNOSIS — R0600 Dyspnea, unspecified: Secondary | ICD-10-CM | POA: Diagnosis present

## 2019-07-14 LAB — CBC WITH DIFFERENTIAL/PLATELET
Abs Immature Granulocytes: 0.03 10*3/uL (ref 0.00–0.07)
Basophils Absolute: 0 10*3/uL (ref 0.0–0.1)
Basophils Relative: 0 %
Eosinophils Absolute: 0 10*3/uL (ref 0.0–0.5)
Eosinophils Relative: 0 %
HCT: 46.4 % (ref 39.0–52.0)
Hemoglobin: 15.1 g/dL (ref 13.0–17.0)
Immature Granulocytes: 1 %
Lymphocytes Relative: 22 %
Lymphs Abs: 1.1 10*3/uL (ref 0.7–4.0)
MCH: 27.6 pg (ref 26.0–34.0)
MCHC: 32.5 g/dL (ref 30.0–36.0)
MCV: 84.7 fL (ref 80.0–100.0)
Monocytes Absolute: 0.3 10*3/uL (ref 0.1–1.0)
Monocytes Relative: 7 %
Neutro Abs: 3.6 10*3/uL (ref 1.7–7.7)
Neutrophils Relative %: 70 %
Platelets: 167 10*3/uL (ref 150–400)
RBC: 5.48 MIL/uL (ref 4.22–5.81)
RDW: 12.8 % (ref 11.5–15.5)
WBC: 5.1 10*3/uL (ref 4.0–10.5)
nRBC: 0 % (ref 0.0–0.2)

## 2019-07-14 LAB — COMPREHENSIVE METABOLIC PANEL
ALT: 36 U/L (ref 0–44)
AST: 49 U/L — ABNORMAL HIGH (ref 15–41)
Albumin: 3.3 g/dL — ABNORMAL LOW (ref 3.5–5.0)
Alkaline Phosphatase: 46 U/L (ref 38–126)
Anion gap: 8 (ref 5–15)
BUN: 9 mg/dL (ref 6–20)
CO2: 26 mmol/L (ref 22–32)
Calcium: 8.3 mg/dL — ABNORMAL LOW (ref 8.9–10.3)
Chloride: 103 mmol/L (ref 98–111)
Creatinine, Ser: 1.05 mg/dL (ref 0.61–1.24)
GFR calc Af Amer: >60 mL/min (ref >60)
GFR calc non Af Amer: >60 mL/min (ref >60)
Glucose, Bld: 104 mg/dL — ABNORMAL HIGH (ref 70–99)
Potassium: 4.6 mmol/L (ref 3.5–5.1)
Sodium: 137 mmol/L (ref 135–145)
Total Bilirubin: 1 mg/dL (ref 0.3–1.2)
Total Protein: 7.1 g/dL (ref 6.5–8.1)

## 2019-07-14 MED ORDER — GUAIFENESIN-DM 100-10 MG/5ML PO SYRP
5.0000 mL | ORAL_SOLUTION | ORAL | Status: DC | PRN
  Administered 2019-07-14 – 2019-07-15 (×2): 5 mL via ORAL
  Filled 2019-07-14 (×2): qty 10

## 2019-07-14 MED ORDER — IPRATROPIUM-ALBUTEROL 20-100 MCG/ACT IN AERS
1.0000 | INHALATION_SPRAY | Freq: Four times a day (QID) | RESPIRATORY_TRACT | Status: DC
  Administered 2019-07-14 – 2019-07-17 (×10): 1 via RESPIRATORY_TRACT
  Filled 2019-07-14 (×3): qty 4

## 2019-07-14 MED ORDER — HYDROCOD POLST-CPM POLST ER 10-8 MG/5ML PO SUER
5.0000 mL | Freq: Two times a day (BID) | ORAL | Status: DC
  Administered 2019-07-14 – 2019-07-17 (×6): 5 mL via ORAL
  Filled 2019-07-14 (×7): qty 5

## 2019-07-14 MED ORDER — BENZONATATE 100 MG PO CAPS
100.0000 mg | ORAL_CAPSULE | Freq: Three times a day (TID) | ORAL | Status: DC | PRN
  Administered 2019-07-14 (×2): 100 mg via ORAL
  Filled 2019-07-14 (×2): qty 1

## 2019-07-14 MED ORDER — DEXAMETHASONE SODIUM PHOSPHATE 10 MG/ML IJ SOLN
6.0000 mg | INTRAMUSCULAR | Status: DC
  Administered 2019-07-14 – 2019-07-16 (×3): 6 mg via INTRAVENOUS
  Filled 2019-07-14 (×3): qty 1

## 2019-07-14 MED ORDER — GUAIFENESIN 100 MG/5ML PO SOLN
5.0000 mL | ORAL | Status: DC | PRN
  Administered 2019-07-14 (×2): 100 mg via ORAL
  Filled 2019-07-14 (×2): qty 10

## 2019-07-14 MED ORDER — ALBUTEROL SULFATE HFA 108 (90 BASE) MCG/ACT IN AERS: 1.0000 | INHALATION_SPRAY | RESPIRATORY_TRACT | Status: DC | PRN

## 2019-07-14 NOTE — TOC Progression Note (Signed)
Transition of Care [facility]) - Progression Note    Patient Details  Name: Duane Jackson MRN: [id number] Date of Birth: 28-May-1965  Transition of Care [facility] CM/SW Contact  Purcell Mouton, RN Phone Number: 07/14/2019, 12:17 PM  Clinical Narrative:    Pt plan to discharge home with no needs at present time.    Expected Discharge Plan: Home/Self Care Barriers to Discharge: No Barriers Identified  Expected Discharge Plan and Services Expected Discharge Plan: Home/Self Care   Discharge Planning Services: CM Consult   Living arrangements for the past 2 months: Single Family Home                                       Social Determinants of Health (SDOH) Interventions    Readmission Risk Interventions No flowsheet data found.

## 2019-07-14 NOTE — Plan of Care (Signed)
Initiated COVID 19 care plan   

## 2019-07-14 NOTE — ED Notes (Signed)
Attempted to call report to Tess, Therapist, sports. She will call back when available.

## 2019-07-14 NOTE — Progress Notes (Signed)
PROGRESS NOTE    Duane Jackson  [id number] DOB: 11-May-1965 DOA: 07/13/2019 PCP: Hulan Fess, MD    Brief Narrative:  Patient admitted to the hospital with working diagnosis of SARS COVID-19 viral pneumonia.  54 year old male who presented with dyspnea.  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  Patient diagnosed with COVID-19 5/11.  His symptoms were consistent with chills and myalgias.  Over the last 24 hours prior to hospitalization he has being experiencing dry cough and dyspnea. On his initial physical examination blood pressure 145/75, heart rate 91, respiratory rate 26-32, temperature 39 C.  He had increased work of breathing, his lungs had bilateral rales, no wheezing, heart S1-S2, present tachycardic abdomen soft, no lower extremity edema. Chest radiograph with bilateral interstitial alveolar infiltrates, base of the right upper lobe, and left lower lobe, mid lung zones. EKG 93 bpm, normal axis, normal intervals, sinus rhythm, no ST segment or T wave changes.    Assessment & Plan:   Active Problems:   Pneumonia due to COVID-19 virus   1. SARS COVID 19 viral pneumonia.   RR: 28  Pulse oxymetry: 97%  Fi02: room air.   COVID-19 Labs  Recent Labs    07/13/19 1650  DDIMER 0.35  FERRITIN 423*  LDH 188  CRP 8.0*    Lab Results  Component Value Date   SARSCOV2NAA POSITIVE (A) 07/13/2019    Patient ill looking appearing, continue to have significant dyspnea and not yet back to his baseline.   Continue medical therapy with Remdesivir and systemic steroids with dexamethasone. Patient with no hypoxemia but with significant bilateral infiltrates and increased work of breathing, imminent hypoxemia.   Continue with antitussive agents, bronchodilators and airway clearing techniques with flutter valve and incentive spirometer. Will hold on IV fluids, follow a restrictive IV fluids strategy.   Status  is: Observation  The patient will require care spanning > 2 midnights and should be moved to inpatient because: IV treatments appropriate due to intensity of illness or inability to take PO  Dispo: The patient is from: Home              Anticipated d/c is to: Home              Anticipated d/c date is: > 3 days              Patient currently is not medically stable to d/c. Patient with significant increase work of breathing, not back to baseline, with imminent hypoxic respiratory failure due to viral pneumonia.    DVT prophylaxis: Heparin   Code Status:   full  Family Communication:  No family at the bedside       Subjective: Patient not feeling well, continue to have cough and dyspnea, positive fever and generalized malaise, poor appetie.   Objective: Vitals:   07/14/19 0803 07/14/19 0811 07/14/19 0845 07/14/19 0856  BP:      Pulse:   (!) 104 95  Resp: (!) 29  (!) 28 (!) 28  Temp:  99.8 F (37.7 C)    TempSrc:  Oral    SpO2:   97% 97%  Weight:      Height:        Intake/Output Summary (Last 24 hours) at 07/14/2019 0923 Last data filed at 07/14/2019 0231 Gross per 24 hour  Intake 1082.83 ml  Output --  Net 1082.83 ml   Filed Weights   07/13/19 1655  Weight: 88.5  kg    Examination:   General: deconditioned and ill looking appearing  Neurology: Awake and alert, non focal  E ENT: mild pallor, no icterus, oral mucosa moist Cardiovascular: No JVD. S1-S2 present, rhythmic, no gallops, rubs, or murmurs. No lower extremity edema. Pulmonary: positive breath sounds bilaterally. Gastrointestinal. Abdomen with, no organomegaly, non tender, no rebound or guarding Skin. No rashes Musculoskeletal: no joint deformities     Data Reviewed: I have personally reviewed following labs and imaging studies  CBC: Recent Labs  Lab 07/13/19 1651 07/14/19 0500  WBC 4.3 5.1  NEUTROABS 3.0 3.6  HGB 14.9 15.1  HCT 45.7 46.4  MCV 85.6 84.7  PLT 151 [id number]   Basic Metabolic  Panel: Recent Labs  Lab 07/13/19 1651 07/14/19 0500  NA 133* 137  K 3.7 4.6  CL 101 103  CO2 25 26  GLUCOSE 114* 104*  BUN 12 9  CREATININE 1.06 1.05  CALCIUM 7.9* 8.3*   GFR: Estimated Creatinine Clearance: 89.9 mL/min (by C-G formula based on SCr of 1.05 mg/dL). Liver Function Tests: Recent Labs  Lab 07/13/19 1651 07/14/19 0500  AST 38 49*  ALT 33 36  ALKPHOS 42 46  BILITOT 0.6 1.0  PROT 6.8 7.1  ALBUMIN 3.3* 3.3*   No results for input(s): LIPASE, AMYLASE in the last 168 hours. No results for input(s): AMMONIA in the last 168 hours. Coagulation Profile: Recent Labs  Lab 07/13/19 1651  INR 1.0   Cardiac Enzymes: No results for input(s): CKTOTAL, CKMB, CKMBINDEX, TROPONINI in the last 168 hours. BNP (last 3 results) No results for input(s): PROBNP in the last 8760 hours. HbA1C: No results for input(s): HGBA1C in the last 72 hours. CBG: No results for input(s): GLUCAP in the last 168 hours. Lipid Profile: Recent Labs    07/13/19 1650  TRIG 87   Thyroid Function Tests: No results for input(s): TSH, T4TOTAL, FREET4, T3FREE, THYROIDAB in the last 72 hours. Anemia Panel: Recent Labs    07/13/19 1650  FERRITIN 423*      Radiology Studies: I have reviewed all of the imaging during this hospital visit personally     Scheduled Meds: . heparin  5,000 Units Subcutaneous Q8H   Continuous Infusions: . sodium chloride Stopped (07/14/19 0231)  . remdesivir 100 mg in NS 100 mL       LOS: 1 day        Ramie Palladino Gerome Apley, MD

## 2019-07-15 LAB — COMPREHENSIVE METABOLIC PANEL
ALT: 38 U/L (ref 0–44)
AST: 42 U/L — ABNORMAL HIGH (ref 15–41)
Albumin: 3.1 g/dL — ABNORMAL LOW (ref 3.5–5.0)
Alkaline Phosphatase: 45 U/L (ref 38–126)
Anion gap: 9 (ref 5–15)
BUN: 12 mg/dL (ref 6–20)
CO2: 22 mmol/L (ref 22–32)
Calcium: 8.4 mg/dL — ABNORMAL LOW (ref 8.9–10.3)
Chloride: 102 mmol/L (ref 98–111)
Creatinine, Ser: 0.88 mg/dL (ref 0.61–1.24)
GFR calc Af Amer: >60 mL/min (ref >60)
GFR calc non Af Amer: >60 mL/min (ref >60)
Glucose, Bld: 149 mg/dL — ABNORMAL HIGH (ref 70–99)
Potassium: 3.7 mmol/L (ref 3.5–5.1)
Sodium: 133 mmol/L — ABNORMAL LOW (ref 135–145)
Total Bilirubin: 0.6 mg/dL (ref 0.3–1.2)
Total Protein: 7.1 g/dL (ref 6.5–8.1)

## 2019-07-15 LAB — D-DIMER, QUANTITATIVE: D-Dimer, Quant: 0.52 ug/mL-FEU — ABNORMAL HIGH (ref 0.00–0.50)

## 2019-07-15 LAB — FERRITIN: Ferritin: 622 ng/mL — ABNORMAL HIGH (ref 24–336)

## 2019-07-15 LAB — C-REACTIVE PROTEIN: CRP: 11.2 mg/dL — ABNORMAL HIGH (ref <1.0)

## 2019-07-15 MED ORDER — POLYETHYLENE GLYCOL 3350 17 G PO PACK
17.0000 g | PACK | Freq: Every day | ORAL | Status: DC
  Administered 2019-07-15 – 2019-07-17 (×3): 17 g via ORAL
  Filled 2019-07-15 (×3): qty 1

## 2019-07-15 MED ORDER — POTASSIUM CHLORIDE CRYS ER 20 MEQ PO TBCR
40.0000 meq | EXTENDED_RELEASE_TABLET | Freq: Once | ORAL | Status: AC
  Administered 2019-07-15: 40 meq via ORAL
  Filled 2019-07-15: qty 2

## 2019-07-15 MED ORDER — ENOXAPARIN SODIUM 40 MG/0.4ML ~~LOC~~ SOLN
40.0000 mg | SUBCUTANEOUS | Status: DC
  Administered 2019-07-15 – 2019-07-16 (×2): 40 mg via SUBCUTANEOUS
  Filled 2019-07-15 (×2): qty 0.4

## 2019-07-15 NOTE — Evaluation (Signed)
Physical Therapy One Time Evaluation Patient Details Name: Duane Jackson MRN: [id number] DOB: 1965-12-12 Today's Date: 07/15/2019   History of Present Illness  54 year old male who presented with dyspnea.  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  Patient diagnosed with COVID-19 5/11.  Pt admitted 07/13/19 for Pneumonia due to COVID-19 virus  Clinical Impression  Patient evaluated by Physical Therapy with no further acute PT needs identified. All education has been completed and the patient has no further questions.  See below for any follow-up Physical Therapy or equipment needs. PT is signing off. Thank you for this referral.  Pt reports overall feeling better and ambulated in room.  Pt encouraged to continue mobilizing in room during acute stay as well as performing proning as able.     Follow Up Recommendations No PT follow up    Equipment Recommendations  None recommended by PT    Recommendations for Other Services       Precautions / Restrictions Precautions Precautions: None Restrictions Weight Bearing Restrictions: No      Mobility  Bed Mobility Overal bed mobility: Independent                Transfers Overall transfer level: Modified independent                  Ambulation/Gait Ambulation/Gait assistance: Modified independent (Device/Increase time) Gait Distance (Feet): 288 Feet Assistive device: None Gait Pattern/deviations: Step-through pattern;Decreased stride length     General Gait Details: pt denies dizziness and dyspnea, Spo2 95-97% on room air during ambulation  Stairs            Wheelchair Mobility    Modified Rankin (Stroke Patients Only)       Balance Overall balance assessment: No apparent balance deficits (not formally assessed)                                           Pertinent Vitals/Pain Pain Assessment: No/denies pain    Home  Living Family/patient expects to be discharged to:: Private residence Living Arrangements: Alone   Type of Home: House       Home Layout: Two level Home Equipment: None      Prior Function Level of Independence: Independent               Hand Dominance        Extremity/Trunk Assessment        Lower Extremity Assessment Lower Extremity Assessment: Overall WFL for tasks assessed    Cervical / Trunk Assessment Cervical / Trunk Assessment: Normal  Communication   Communication: No difficulties  Cognition Arousal/Alertness: Awake/alert Behavior During Therapy: WFL for tasks assessed/performed Overall Cognitive Status: Within Functional Limits for tasks assessed                                        General Comments      Exercises     Assessment/Plan    PT Assessment Patent does not need any further PT services  PT Problem List         PT Treatment Interventions      PT Goals (Current goals can be found in the Care Plan section)  Acute Rehab PT Goals PT Goal Formulation: All assessment and  education complete, DC therapy    Frequency     Barriers to discharge        Co-evaluation               AM-PAC PT "6 Clicks" Mobility  Outcome Measure Help needed turning from your back to your side while in a flat bed without using bedrails?: None Help needed moving from lying on your back to sitting on the side of a flat bed without using bedrails?: None Help needed moving to and from a bed to a chair (including a wheelchair)?: None Help needed standing up from a chair using your arms (e.g., wheelchair or bedside chair)?: None Help needed to walk in hospital room?: None Help needed climbing 3-5 steps with a railing? : A Little 6 Click Score: 23    End of Session   Activity Tolerance: Patient tolerated treatment well Patient left: in chair;with call bell/phone within reach Nurse Communication: Mobility status PT Visit Diagnosis:  Difficulty in walking, not elsewhere classified (R26.2)    Time: [id number] PT Time Calculation (min) (ACUTE ONLY): 20 min   Charges:   PT Evaluation $PT Eval Low Complexity: 1 Low        Kati PT, DPT Acute Rehabilitation Services Office: [phone]  Trena Platt 07/15/2019, 12:37 PM

## 2019-07-15 NOTE — Progress Notes (Signed)
   07/15/19 1133  Assess: MEWS Score  Temp 99 F (37.2 C)  BP 109/74  Pulse Rate 86  Resp (!) 22  Level of Consciousness Alert  SpO2 95 %  O2 Device Room Air  Assess: MEWS Score  MEWS Temp 0  MEWS Systolic 0  MEWS Pulse 0  MEWS RR 2  MEWS LOC 0  MEWS Score 2  MEWS Score Color Yellow  Assess: if the MEWS score is Yellow or Red  Were vital signs taken at a resting state? Yes  Focused Assessment Documented focused assessment  Early Detection of Sepsis Score *See Row Information* Low  MEWS guidelines implemented *See Row Information* Yes  Treat  MEWS Interventions Other (Comment) ( yellow d/t RR now decreased from 24 to 22 pt O2 sat 95 RA)  Take Vital Signs  Increase Vital Sign Frequency  Yellow: Q 2hr X 2 then Q 4hr X 2, if remains yellow, continue Q 4hrs  Escalate  MEWS: Escalate Yellow: discuss with charge nurse/RN and consider discussing with provider and RRT  Notify: Charge Nurse/RN  Name of Charge Nurse/RN Notified Vesta  Date Charge Nurse/RN Notified 07/15/19  Time Charge Nurse/RN Notified 1130

## 2019-07-15 NOTE — Progress Notes (Signed)
PROGRESS NOTE    Duane Jackson  [id number] DOB: 1965/11/25 DOA: 07/13/2019 PCP: Hulan Fess, MD    Brief Narrative:  Patient admitted to the hospital with working diagnosis of SARS COVID-19 viral pneumonia.  54 year old male who presented with dyspnea.  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  Patient diagnosed with COVID-19 5/11.  His symptoms were consistent with chills and myalgias.  Over the last 24 hours prior to hospitalization he has being experiencing dry cough and dyspnea. On his initial physical examination blood pressure 145/75, heart rate 91, respiratory rate 26-32, temperature 39 C.  He had increased work of breathing, his lungs had bilateral rales, no wheezing, heart S1-S2, present tachycardic abdomen soft, no lower extremity edema. Chest radiograph with bilateral interstitial alveolar infiltrates, base of the right upper lobe, and left lower lobe, mid lung zones. EKG 93 bpm, normal axis, normal intervals, sinus rhythm, no ST segment or T wave changes.  Patient with no hypoxemia but with significant bilateral infiltrates and increased work of breathing, imminent hypoxemia.   Inflammatory markers are trending up but clinically patient with improved symptoms.    Assessment & Plan:   Active Problems:   Pneumonia due to COVID-19 virus   1. SARS COVID 19 viral pneumonia.   RR: 24  Pulse oxymetry: 95%  Fi02: room air.   COVID-19 Labs  Recent Labs    07/13/19 1650 07/15/19 0424  DDIMER 0.35 0.52*  FERRITIN 423* 622*  LDH 188  --   CRP 8.0* 11.2*    Lab Results  Component Value Date   SARSCOV2NAA POSITIVE (A) 07/13/2019    Patient reports improvement in his symptoms, improve dyspnea and fatigue but not yet back to baseline.  Inflammatory markers are trending up, but no increase in oxygen requirements.   Tolerating well medical therapy with Remdesivir ( AST 42, ALT 38), systemic  steroids with dexamethasone.  Continue with antitussive agents, bronchodilators and airway clearing techniques with flutter valve and incentive spirometer.   Out of bed to chair tid with meals, follow with physical and occupational therapy.  2. Hypokalemia, non anion gap metabolic acidosis. Serum K down to 3,7, renal function stable with serum cr at 0,88 and bicarb at 22.   Will give 40 meq Kcl and follow renal panel in am.   Status is: Inpatient  Remains inpatient appropriate because:IV treatments appropriate due to intensity of illness or inability to take PO   Dispo: The patient is from: Home              Anticipated d/c is to: Home              Anticipated d/c date is: 3 days              Patient currently is not medically stable to d/c.   DVT prophylaxis: Enoxaparin   Code Status:   full  Family Communication:  No family at the bedside     Subjective: Patient improvement in dyspnea, but not yet back to baseline, no nausea or vomiting, low appetite.   Objective: Vitals:   07/14/19 2010 07/14/19 2211 07/14/19 2220 07/15/19 0457  BP: 131/74 123/76  136/84  Pulse: 92 89  80  Resp:  (!) 30 18 (!) 24  Temp: 99.5 F (37.5 C) 100 F (37.8 C)  99.3 F (37.4 C)  TempSrc: Oral Oral  Oral  SpO2: 96% 94%  95%  Weight:  Height:        Intake/Output Summary (Last 24 hours) at 07/15/2019 0915 Last data filed at 07/15/2019 0334 Gross per 24 hour  Intake 561.8 ml  Output 1950 ml  Net -1388.2 ml   Filed Weights   07/13/19 1655  Weight: 88.5 kg    Examination:   General: Not in pain or dyspnea, deconditioned  Neurology: Awake and alert, non focal  E ENT: mild pallor, no icterus, oral mucosa moist Cardiovascular: No JVD. S1-S2 present, rhythmic, no gallops, rubs, or murmurs. No lower extremity edema. Pulmonary: vesicular breath sounds bilaterally,. Gastrointestinal. Abdomen with  no organomegaly, non tender, no rebound or guarding Skin. No rashes Musculoskeletal:  no joint deformities     Data Reviewed: I have personally reviewed following labs and imaging studies  CBC: Recent Labs  Lab 07/13/19 1651 07/14/19 0500  WBC 4.3 5.1  NEUTROABS 3.0 3.6  HGB 14.9 15.1  HCT 45.7 46.4  MCV 85.6 84.7  PLT 151 [id number]   Basic Metabolic Panel: Recent Labs  Lab 07/13/19 1651 07/14/19 0500 07/15/19 0424  NA 133* 137 133*  K 3.7 4.6 3.7  CL 101 103 102  CO2 25 26 22   GLUCOSE 114* 104* 149*  BUN 12 9 12   CREATININE 1.06 1.05 0.88  CALCIUM 7.9* 8.3* 8.4*   GFR: Estimated Creatinine Clearance: 107.3 mL/min (by C-G formula based on SCr of 0.88 mg/dL). Liver Function Tests: Recent Labs  Lab 07/13/19 1651 07/14/19 0500 07/15/19 0424  AST 38 49* 42*  ALT 33 36 38  ALKPHOS 42 46 45  BILITOT 0.6 1.0 0.6  PROT 6.8 7.1 7.1  ALBUMIN 3.3* 3.3* 3.1*   No results for input(s): LIPASE, AMYLASE in the last 168 hours. No results for input(s): AMMONIA in the last 168 hours. Coagulation Profile: Recent Labs  Lab 07/13/19 1651  INR 1.0   Cardiac Enzymes: No results for input(s): CKTOTAL, CKMB, CKMBINDEX, TROPONINI in the last 168 hours. BNP (last 3 results) No results for input(s): PROBNP in the last 8760 hours. HbA1C: No results for input(s): HGBA1C in the last 72 hours. CBG: No results for input(s): GLUCAP in the last 168 hours. Lipid Profile: Recent Labs    07/13/19 1650  TRIG 87   Thyroid Function Tests: No results for input(s): TSH, T4TOTAL, FREET4, T3FREE, THYROIDAB in the last 72 hours. Anemia Panel: Recent Labs    07/13/19 1650 07/15/19 0424  FERRITIN 423* 622*      Radiology Studies: I have reviewed all of the imaging during this hospital visit personally     Scheduled Meds: . chlorpheniramine-HYDROcodone  5 mL Oral Q12H  . dexamethasone (DECADRON) injection  6 mg Intravenous Q24H  . heparin  5,000 Units Subcutaneous Q8H  . Ipratropium-Albuterol  1 puff Inhalation QID   Continuous Infusions: . remdesivir 100 mg  in NS 100 mL 100 mg (07/14/19 1010)     LOS: 2 days        Jonell Brumbaugh Gerome Apley, MD

## 2019-07-15 NOTE — Plan of Care (Signed)
  Problem: Clinical Measurements: Goal: Ability to maintain clinical measurements within normal limits will improve Outcome: Progressing Goal: Will remain free from infection Outcome: Progressing Goal: Respiratory complications will improve Outcome: Progressing Goal: Cardiovascular complication will be avoided Outcome: Progressing   Problem: Activity: Goal: Risk for activity intolerance will decrease Outcome: Progressing   Problem: Nutrition: Goal: Adequate nutrition will be maintained Outcome: Progressing   Problem: Education: Goal: Knowledge of risk factors and measures for prevention of condition will improve Outcome: Progressing   Problem: Coping: Goal: Psychosocial and spiritual needs will be supported Outcome: Progressing Pt reports feeling much better this evening than when he first arrived, sitting up in the chair   Problem: Respiratory: Goal: Will maintain a patent airway Outcome: Progressing Pt remains on RA, dry cough, sometimes productive.   Goal: Complications related to the disease process, condition or treatment will be avoided or minimized Outcome: Progressing

## 2019-07-15 NOTE — Progress Notes (Signed)
OT Cancellation Note  Patient Details Name: Duane Jackson MRN: [id number] DOB: [birth year]   Cancelled Treatment:    Reason Eval/Treat Not Completed: OT screened, no needs identified, will sign off(Per PT patient has no OT needs.)  Glena Pharris L Alizah Sills 07/15/2019, 4:30 PM

## 2019-07-16 LAB — COMPREHENSIVE METABOLIC PANEL
ALT: 36 U/L (ref 0–44)
AST: 33 U/L (ref 15–41)
Albumin: 3.1 g/dL — ABNORMAL LOW (ref 3.5–5.0)
Alkaline Phosphatase: 41 U/L (ref 38–126)
Anion gap: 8 (ref 5–15)
BUN: 18 mg/dL (ref 6–20)
CO2: 22 mmol/L (ref 22–32)
Calcium: 8.6 mg/dL — ABNORMAL LOW (ref 8.9–10.3)
Chloride: 104 mmol/L (ref 98–111)
Creatinine, Ser: 1.02 mg/dL (ref 0.61–1.24)
GFR calc Af Amer: >60 mL/min (ref >60)
GFR calc non Af Amer: >60 mL/min (ref >60)
Glucose, Bld: 136 mg/dL — ABNORMAL HIGH (ref 70–99)
Potassium: 4.3 mmol/L (ref 3.5–5.1)
Sodium: 134 mmol/L — ABNORMAL LOW (ref 135–145)
Total Bilirubin: 0.5 mg/dL (ref 0.3–1.2)
Total Protein: 6.8 g/dL (ref 6.5–8.1)

## 2019-07-16 LAB — C-REACTIVE PROTEIN: CRP: 5.8 mg/dL — ABNORMAL HIGH (ref <1.0)

## 2019-07-16 LAB — FERRITIN: Ferritin: 648 ng/mL — ABNORMAL HIGH (ref 24–336)

## 2019-07-16 LAB — D-DIMER, QUANTITATIVE: D-Dimer, Quant: 0.31 ug/mL-FEU (ref 0.00–0.50)

## 2019-07-16 NOTE — Progress Notes (Signed)
PROGRESS NOTE    Duane Jackson  [id number] DOB: 1965/10/18 DOA: 07/13/2019 PCP: Hulan Fess, MD    Brief Narrative:  Patientadmitted to the hospital with working diagnosis of SARS COVID-19 viral pneumonia.  54 year old male who presented with dyspnea. 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 Patient diagnosed with COVID-19 5/11. His symptoms were consistent with chills and myalgias. Over the last 24 hours prior to hospitalization he has beingexperiencingdry cough and dyspnea. On his initial physical examination blood pressure 145/75, heart rate 91, respiratory rate 26-32, temperature 39 C.He had increased work of breathing, his lungs had bilateral rales, no wheezing, heart S1-S2, present tachycardic abdomen soft, no lower extremity edema. Chest radiograph with bilateral interstitial alveolar infiltrates, base of the right upper lobe, and left lower lobe,mid lung zones. EKG 93 bpm, normal axis, normal intervals, sinus rhythm, no ST segment or T wave changes.  Patient with no hypoxemia but with significant bilateral infiltrates and increased work of breathing, imminent hypoxemia.   Inflammatory markers are trending up but clinically patient with improved symptoms.     Assessment & Plan:   Active Problems:   Pneumonia due to COVID-19 virus   1. SARS COVID 19 viral pneumonia.  RR:21 to 22  Pulse oxymetry: 96%  Fi02: room air.  COVID-19 Labs  Recent Labs    07/13/19 1650 07/15/19 0424 07/16/19 0350  DDIMER 0.35 0.52* 0.31  FERRITIN 423* 622* 648*  LDH 188  --   --   CRP 8.0* 11.2* 5.8*    Lab Results  Component Value Date   SARSCOV2NAA POSITIVE (A) 07/13/2019    Inflammatory markers are improving, along with his symptoms, he is out of bed to chair this am. Not yet back at his baseline.    Continue medical therapy with Remdesivir #4/5 ( AST 33, ALT 68), continue with systemic  steroids with dexamethasone.  On antitussive agents, bronchodilators and airway clearing techniques with flutter valve and incentive spirometer.   Plan for possible dc home in am.   2. Hypokalemia, non anion gap metabolic acidosis. Electrolytes have been corrected, renal function continue to be stable. Continue to encourage po intake, holding IV fluids.   Status is: Inpatient  Remains inpatient appropriate because:IV treatments appropriate due to intensity of illness or inability to take PO   Dispo: The patient is from: Home              Anticipated d/c is to: Home              Anticipated d/c date is: 1 day              Patient currently is not medically stable to d/c.   DVT prophylaxis: Enoxaparin   Code Status:   full  Family Communication:  No family at the bedside      Subjective: Patient is feeling better, but not yet back to baseline, continue to have dyspnea and cough, no chest pain, no nausea or vomiting.   Objective: Vitals:   07/15/19 1804 07/15/19 2149 07/16/19 0205 07/16/19 0626  BP: (!) 150/82 114/67 103/60 101/64  Pulse: 91 83 69 67  Resp: (!) 26 (!) 24 (!) 22 (!) 21  Temp: 99.6 F (37.6 C) 99.4 F (37.4 C) 98.4 F (36.9 C) 98.3 F (36.8 C)  TempSrc: Oral   Oral  SpO2: 95% 96% 96% 95%  Weight:      Height:        Intake/Output  Summary (Last 24 hours) at 07/16/2019 0906 Last data filed at 07/16/2019 [id number] Gross per 24 hour  Intake 700 ml  Output 2050 ml  Net -1350 ml   Filed Weights   07/13/19 1655  Weight: 88.5 kg    Examination:   General: Not in pain or dyspnea, deconditioned  Neurology: Awake and alert, non focal  E ENT: mild pallor, no icterus, oral mucosa moist Cardiovascular: No JVD. S1-S2 present, rhythmic, no gallops, rubs, or murmurs. No lower extremity edema. Pulmonary: positive breath sounds bilaterally, with no wheezing, rhonchi or rales. Gastrointestinal. Abdomen with no organomegaly, non tender, no rebound or guarding Skin.  No rashes Musculoskeletal: no joint deformities     Data Reviewed: I have personally reviewed following labs and imaging studies  CBC: Recent Labs  Lab 07/13/19 1651 07/14/19 0500  WBC 4.3 5.1  NEUTROABS 3.0 3.6  HGB 14.9 15.1  HCT 45.7 46.4  MCV 85.6 84.7  PLT 151 [id number]   Basic Metabolic Panel: Recent Labs  Lab 07/13/19 1651 07/14/19 0500 07/15/19 0424 07/16/19 0350  NA 133* 137 133* 134*  K 3.7 4.6 3.7 4.3  CL 101 103 102 104  CO2 25 26 22 22   GLUCOSE 114* 104* 149* 136*  BUN 12 9 12 18   CREATININE 1.06 1.05 0.88 1.02  CALCIUM 7.9* 8.3* 8.4* 8.6*   GFR: Estimated Creatinine Clearance: 92.6 mL/min (by C-G formula based on SCr of 1.02 mg/dL). Liver Function Tests: Recent Labs  Lab 07/13/19 1651 07/14/19 0500 07/15/19 0424 07/16/19 0350  AST 38 49* 42* 33  ALT 33 36 38 36  ALKPHOS 42 46 45 41  BILITOT 0.6 1.0 0.6 0.5  PROT 6.8 7.1 7.1 6.8  ALBUMIN 3.3* 3.3* 3.1* 3.1*   No results for input(s): LIPASE, AMYLASE in the last 168 hours. No results for input(s): AMMONIA in the last 168 hours. Coagulation Profile: Recent Labs  Lab 07/13/19 1651  INR 1.0   Cardiac Enzymes: No results for input(s): CKTOTAL, CKMB, CKMBINDEX, TROPONINI in the last 168 hours. BNP (last 3 results) No results for input(s): PROBNP in the last 8760 hours. HbA1C: No results for input(s): HGBA1C in the last 72 hours. CBG: No results for input(s): GLUCAP in the last 168 hours. Lipid Profile: Recent Labs    07/13/19 1650  TRIG 87   Thyroid Function Tests: No results for input(s): TSH, T4TOTAL, FREET4, T3FREE, THYROIDAB in the last 72 hours. Anemia Panel: Recent Labs    07/15/19 0424 07/16/19 0350  FERRITIN 622* 648*      Radiology Studies: I have reviewed all of the imaging during this hospital visit personally     Scheduled Meds: . chlorpheniramine-HYDROcodone  5 mL Oral Q12H  . dexamethasone (DECADRON) injection  6 mg Intravenous Q24H  . enoxaparin (LOVENOX)  injection  40 mg Subcutaneous Q24H  . Ipratropium-Albuterol  1 puff Inhalation QID  . polyethylene glycol  17 g Oral Daily   Continuous Infusions: . remdesivir 100 mg in NS 100 mL 100 mg (07/15/19 0959)     LOS: 3 days        Aashi Derrington Gerome Apley, MD

## 2019-07-16 NOTE — Progress Notes (Signed)
   07/15/19 2149  Assess: MEWS Score  Temp 99.4 F (37.4 C)  BP 114/67  Pulse Rate 83  Resp (!) 24  Level of Consciousness Alert  SpO2 96 %  O2 Device Room Air  Assess: MEWS Score  MEWS Temp 0  MEWS Systolic 0  MEWS Pulse 0  MEWS RR 2  MEWS LOC 0  MEWS Score 2  MEWS Score Color Yellow  Assess: if the MEWS score is Yellow or Red  MEWS guidelines implemented *See Row Information* No, previously yellow, continue vital signs every 4 hours   Pt continues to be MEWS yellow for RR, Pt on room air in no distress, this is not an acute change.  Will continue to monitor

## 2019-07-17 DIAGNOSIS — E876 Hypokalemia: Secondary | ICD-10-CM

## 2019-07-17 DIAGNOSIS — C07 Malignant neoplasm of parotid gland: Secondary | ICD-10-CM

## 2019-07-17 DIAGNOSIS — E872 Acidosis, unspecified: Secondary | ICD-10-CM

## 2019-07-17 LAB — COMPREHENSIVE METABOLIC PANEL
ALT: 73 U/L — ABNORMAL HIGH (ref 0–44)
AST: 74 U/L — ABNORMAL HIGH (ref 15–41)
Albumin: 3 g/dL — ABNORMAL LOW (ref 3.5–5.0)
Alkaline Phosphatase: 41 U/L (ref 38–126)
Anion gap: 6 (ref 5–15)
BUN: 16 mg/dL (ref 6–20)
CO2: 25 mmol/L (ref 22–32)
Calcium: 8.8 mg/dL — ABNORMAL LOW (ref 8.9–10.3)
Chloride: 106 mmol/L (ref 98–111)
Creatinine, Ser: 0.89 mg/dL (ref 0.61–1.24)
GFR calc Af Amer: >60 mL/min (ref >60)
GFR calc non Af Amer: >60 mL/min (ref >60)
Glucose, Bld: 118 mg/dL — ABNORMAL HIGH (ref 70–99)
Potassium: 4.3 mmol/L (ref 3.5–5.1)
Sodium: 137 mmol/L (ref 135–145)
Total Bilirubin: 0.7 mg/dL (ref 0.3–1.2)
Total Protein: 6.8 g/dL (ref 6.5–8.1)

## 2019-07-17 LAB — D-DIMER, QUANTITATIVE: D-Dimer, Quant: 0.29 ug/mL-FEU (ref 0.00–0.50)

## 2019-07-17 LAB — FERRITIN: Ferritin: 788 ng/mL — ABNORMAL HIGH (ref 24–336)

## 2019-07-17 LAB — C-REACTIVE PROTEIN: CRP: 3.2 mg/dL — ABNORMAL HIGH (ref <1.0)

## 2019-07-17 MED ORDER — GUAIFENESIN-DM 100-10 MG/5ML PO SYRP: 5.0000 mL | ORAL_SOLUTION | Freq: Four times a day (QID) | ORAL | 0 refills | Status: AC | PRN

## 2019-07-17 MED ORDER — ALBUTEROL SULFATE HFA 108 (90 BASE) MCG/ACT IN AERS: 1.0000 | INHALATION_SPRAY | RESPIRATORY_TRACT | 0 refills | Status: AC | PRN

## 2019-07-17 NOTE — Discharge Summary (Signed)
Physician Discharge Summary  Duane Jackson [id number] DOB: [birth year] DOA: 07/13/2019  PCP: Hulan Fess, MD  Admit date: 07/13/2019 Discharge date: 07/17/2019  Admitted From: Home  Disposition:  Home   Recommendations for Outpatient Follow-up and new medication changes:  1. Follow up with Dr. Rex Kras in 2 weeks.  2. Continue self quarantine for 2 weeks, use a mask in public and maintain physical distancing.  3. Patient will continue as needed antitussive agent and bronchodilator therapy.   Home Health: no   Equipment/Devices: no    Discharge Condition: stable  CODE STATUS: full  Diet recommendation: regular.   Brief/Interim Summary: Patientwas admitted to the hospital with working diagnosis of SARS COVID-19 viral pneumonia.  54 year old male who presented with dyspnea. 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 Patient diagnosed with COVID-19 5/11. His symptoms were consistent with chills and myalgias. Over the last 24 hours prior to hospitalization he has beingexperiencingdry cough and dyspnea. On his initial physical examination blood pressure 145/75, heart rate 91, respiratory rate 26-32, temperature 39 C.He had increased work of breathing, his lungs had bilateral rales, no wheezing, heart S1-S2, present tachycardic abdomen soft, no lower extremity edema. Chest radiograph with bilateral interstitial alveolar infiltrates, base of the right upper lobe, and left lower lobe,mid lung zones. EKG 93 bpm, normal axis, normal intervals, sinus rhythm, no ST segment or T wave changes.  Patient with no hypoxemia but with significant bilateral infiltrates and increased work of breathing, with imminent hypoxemia, received IV steroids and remdesivir with improvement of his symptoms.   1.  SARS COVID-19 viral pneumonia.  Patient was admitted to the medical ward, he had close oximetry monitoring, he received  intravenous dexamethasone and remdesivir.  Patient also received bronchodilators, antitussive agents and airway clearing techniques with incentive spirometer and flutter valve.  He responded well to medical therapy with improvement of his symptoms, and inflammatory markers.   COVID-19 Labs  Recent Labs    07/15/19 0424 07/16/19 0350 07/17/19 0402  DDIMER 0.52* 0.31 0.29  FERRITIN 622* 648* 788*  CRP 11.2* 5.8* 3.2*    Lab Results  Component Value Date   SARSCOV2NAA POSITIVE (A) 07/13/2019    2.  Hypokalemia with known anion gap metabolic acidosis.  Patient's electrolytes were corrected, he received gentle hydration with IV fluids.  His oral intake improved, his discharge potassium is 4.3, sodium 137, bicarb 25, BUN 16 and creatinine 0.89.    Discharge Diagnoses:  Principal Problem:   Pneumonia due to COVID-19 virus Active Problems:   Cancer of parotid gland ([place])   Hypokalemia   Metabolic acidosis    Discharge Instructions   Allergies as of 07/17/2019      Reactions   Codeine Nausea And Vomiting   Pork-derived Products Nausea And Vomiting      Medication List    TAKE these medications   albuterol 108 (90 Base) MCG/ACT inhaler Commonly known as: VENTOLIN HFA Inhale 1 puff into the lungs every 4 (four) hours as needed for wheezing or shortness of breath.   guaiFENesin-dextromethorphan 100-10 MG/5ML syrup Commonly known as: ROBITUSSIN DM Take 5 mLs by mouth every 6 (six) hours as needed for cough.      Follow-up Information    Little, Lennette Bihari, MD Follow up in 2 week(s).   Specialty: Family Medicine Contact information: [place] [address] [phone]          Allergies  Allergen Reactions  . Codeine Nausea  And Vomiting  . Pork-Derived Products Nausea And Vomiting      Procedures/Studies: DG Chest Port 1 View  Result Date: 07/13/2019 CLINICAL DATA:  Cough and shortness of breath.  COVID positive. EXAM: PORTABLE CHEST 1  VIEW COMPARISON:  None. FINDINGS: 1726 hours. The cardiopericardial silhouette is within normal limits for size. Patchy/nodular opacities are seen in the lungs bilaterally with central predominance. No pleural effusion. No pneumothorax. The visualized bony structures of the thorax are intact. Telemetry leads overlie the chest. IMPRESSION: Centrally predominant patchy/nodular opacity in the lungs bilaterally. Imaging features likely infectious given the reported history of COVID positive. Given the somewhat nodular appearance, close follow-up recommended to ensure resolution. Electronically Signed   By: Misty Stanley M.D.   On: 07/13/2019 17:41       Subjective: Patient is feeling better, dyspnea has improved, no nausea or vomiting, no chest pain.   Discharge Exam: Vitals:   07/16/19 2157 07/17/19 0546  BP:  102/65  Pulse:  61  Resp: (!) 22 19  Temp: 99 F (37.2 C) 98.4 F (36.9 C)  SpO2:  95%   Vitals:   07/16/19 1418 07/16/19 2034 07/16/19 2157 07/17/19 0546  BP: 136/77 131/76  102/65  Pulse: 90 93  61  Resp: 19 (!) 28 (!) 22 19  Temp: 98.5 F (36.9 C) 100 F (37.8 C) 99 F (37.2 C) 98.4 F (36.9 C)  TempSrc: Oral Oral Oral Oral  SpO2: 97% 95%  95%  Weight:      Height:        General: Not in pain or dyspnea.  Neurology: Awake and alert, non focal  E ENT: no pallor, no icterus, oral mucosa moist Cardiovascular: No JVD. S1-S2 present, rhythmic, no gallops, rubs, or murmurs. No lower extremity edema. Pulmonary: positive breath sounds bilaterally. Gastrointestinal. Abdomen with no organomegaly, non tender, no rebound or guarding Skin. No rashes Musculoskeletal: no joint deformities   The results of significant diagnostics from this hospitalization (including imaging, microbiology, ancillary and laboratory) are listed below for reference.     Microbiology: Recent Results (from the past 240 hour(s))  Culture, blood (Routine x 2)     Status: None (Preliminary result)    Collection Time: 07/13/19  4:42 PM   Specimen: BLOOD RIGHT FOREARM  Result Value Ref Range Status   Specimen Description   Final    BLOOD RIGHT FOREARM Performed at [facility] [address], [place], [place] [address]    Special Requests   Final    BOTTLES DRAWN AEROBIC AND ANAEROBIC Blood Culture adequate volume Performed at [facility] [address] [place], [place] [address]    Culture   Final    NO GROWTH 4 DAYS Performed at [facility], [place] [address] [place], [place] [address]    Report Status PENDING  Incomplete  Culture, blood (Routine x 2)     Status: None (Preliminary result)   Collection Time: 07/13/19  4:47 PM   Specimen: BLOOD LEFT FOREARM  Result Value Ref Range Status   Specimen Description   Final    BLOOD LEFT FOREARM Performed at [facility] [address] [place], [place] [address]    Special Requests   Final    BOTTLES DRAWN AEROBIC AND ANAEROBIC Blood Culture adequate volume Performed at [facility] [address] [place], [place] [address]    Culture   Final    NO GROWTH 4 DAYS Performed at  [facility], [place] [address] [place], [place] [address]    Report Status PENDING  Incomplete  SARS Coronavirus 2 by RT PCR (hospital order, performed in [facility] hospital lab) Nasopharyngeal Nasopharyngeal Swab     Status: Abnormal   Collection Time: 07/13/19  4:59 PM   Specimen: Nasopharyngeal Swab  Result Value Ref Range Status   SARS Coronavirus 2 POSITIVE (A) NEGATIVE Final    Comment: RESULT CALLED TO, READ BACK BY AND VERIFIED WITH: HAMILTON,L. RN @1954  07/13/19 BILLINGSLEY,L (NOTE) SARS-CoV-2 target nucleic acids are DETECTED SARS-CoV-2 RNA is generally detectable in upper respiratory specimens  during the acute phase of infection.  Positive results are indicative  of the presence of the identified virus, but do not rule out bacterial infection or  co-infection with other pathogens not detected by the test.  Clinical correlation with patient history and  other diagnostic information is necessary to determine patient infection status.  The expected result is negative. Fact Sheet for Patients:   StrictlyIdeas.no  Fact Sheet for Healthcare Providers:   BankingDealers.co.za   This test is not yet approved or cleared by the [place] FDA and  has been authorized for detection and/or diagnosis of SARS-CoV-2 by FDA under an Emergency Use Authorization (EUA).  This EUA will remain in effect (meaning this te st can be used) for the duration of  the COVID-19 declaration under Section 564(b)(1) of the Act, 21 U.S.C. section 360-bbb-3(b)(1), unless the authorization is terminated or revoked sooner. Performed at [facility], [place] [address], [place], [place] [address]      Labs: BNP (last 3 results) No results for input(s): BNP in the last 8760 hours. Basic Metabolic Panel: Recent Labs  Lab 07/13/19 1651 07/14/19 0500 07/15/19 0424 07/16/19 0350 07/17/19 0402  NA 133* 137 133* 134* 137  K 3.7 4.6 3.7 4.3 4.3  CL 101 103 102 104 106  CO2 25 26 22 22 25   GLUCOSE 114* 104* 149* 136* 118*  BUN 12 9 12 18 16   CREATININE 1.06 1.05 0.88 1.02 0.89  CALCIUM 7.9* 8.3* 8.4* 8.6* 8.8*   Liver Function Tests: Recent Labs  Lab 07/13/19 1651 07/14/19 0500 07/15/19 0424 07/16/19 0350 07/17/19 0402  AST 38 49* 42* 33 74*  ALT 33 36 38 36 73*  ALKPHOS 42 46 45 41 41  BILITOT 0.6 1.0 0.6 0.5 0.7  PROT 6.8 7.1 7.1 6.8 6.8  ALBUMIN 3.3* 3.3* 3.1* 3.1* 3.0*   No results for input(s): LIPASE, AMYLASE in the last 168 hours. No results for input(s): AMMONIA in the last 168 hours. CBC: Recent Labs  Lab 07/13/19 1651 07/14/19 0500  WBC 4.3 5.1  NEUTROABS 3.0 3.6  HGB 14.9 15.1  HCT 45.7 46.4  MCV 85.6 84.7  PLT 151 167   Cardiac Enzymes: No results for input(s):  CKTOTAL, CKMB, CKMBINDEX, TROPONINI in the last 168 hours. BNP: Invalid input(s): POCBNP CBG: No results for input(s): GLUCAP in the last 168 hours. D-Dimer Recent Labs    07/16/19 0350 07/17/19 0402  DDIMER 0.31 0.29   Hgb A1c No results for input(s): HGBA1C in the last 72 hours. Lipid Profile No results for input(s): CHOL, HDL, LDLCALC, TRIG, CHOLHDL, LDLDIRECT in the last 72 hours. Thyroid function studies No results for input(s): TSH, T4TOTAL, T3FREE, THYROIDAB in the last 72 hours.  Invalid input(s): FREET3 Anemia work up Recent Labs    07/16/19 0350 07/17/19 0402  FERRITIN 648* 788*   Urinalysis  Component Value Date/Time   COLORURINE YELLOW 07/13/2019 1741   APPEARANCEUR CLEAR 07/13/2019 1741   LABSPEC 1.027 07/13/2019 1741   PHURINE 5.0 07/13/2019 1741   GLUCOSEU NEGATIVE 07/13/2019 1741   HGBUR NEGATIVE 07/13/2019 1741   BILIRUBINUR NEGATIVE 07/13/2019 1741   KETONESUR NEGATIVE 07/13/2019 1741   PROTEINUR 100 (A) 07/13/2019 1741   UROBILINOGEN 1.0 09/17/2009 0603   NITRITE NEGATIVE 07/13/2019 1741   LEUKOCYTESUR NEGATIVE 07/13/2019 1741   Sepsis Labs Invalid input(s): PROCALCITONIN,  WBC,  LACTICIDVEN Microbiology Recent Results (from the past 240 hour(s))  Culture, blood (Routine x 2)     Status: None (Preliminary result)   Collection Time: 07/13/19  4:42 PM   Specimen: BLOOD RIGHT FOREARM  Result Value Ref Range Status   Specimen Description   Final    BLOOD RIGHT FOREARM Performed at [facility], [place] [address], [place], [place] [address]    Special Requests   Final    BOTTLES DRAWN AEROBIC AND ANAEROBIC Blood Culture adequate volume Performed at [facility] [address], [place], [place] [address]    Culture   Final    NO GROWTH 4 DAYS Performed at [facility], [place] [address], [place], [place] [address]    Report Status PENDING  Incomplete  Culture, blood (Routine x 2)     Status: None  (Preliminary result)   Collection Time: 07/13/19  4:47 PM   Specimen: BLOOD LEFT FOREARM  Result Value Ref Range Status   Specimen Description   Final    BLOOD LEFT FOREARM Performed at [facility] [address], [place], [place] [address]    Special Requests   Final    BOTTLES DRAWN AEROBIC AND ANAEROBIC Blood Culture adequate volume Performed at [facility] [address] [place], [place] [address]    Culture   Final    NO GROWTH 4 DAYS Performed at [facility], [place] [address], [place], [place] [address]    Report Status PENDING  Incomplete  SARS Coronavirus 2 by RT PCR (hospital order, performed in [facility] hospital lab) Nasopharyngeal Nasopharyngeal Swab     Status: Abnormal   Collection Time: 07/13/19  4:59 PM   Specimen: Nasopharyngeal Swab  Result Value Ref Range Status   SARS Coronavirus 2 POSITIVE (A) NEGATIVE Final    Comment: RESULT CALLED TO, READ BACK BY AND VERIFIED WITH: HAMILTON,L. RN @1954  07/13/19 BILLINGSLEY,L (NOTE) SARS-CoV-2 target nucleic acids are DETECTED SARS-CoV-2 RNA is generally detectable in upper respiratory specimens  during the acute phase of infection.  Positive results are indicative  of the presence of the identified virus, but do not rule out bacterial infection or co-infection with other pathogens not detected by the test.  Clinical correlation with patient history and  other diagnostic information is necessary to determine patient infection status.  The expected result is negative. Fact Sheet for Patients:   StrictlyIdeas.no  Fact Sheet for Healthcare Providers:   BankingDealers.co.za   This test is not yet approved or cleared by the [place] FDA and  has been authorized for detection and/or diagnosis of SARS-CoV-2 by FDA under an Emergency Use Authorization (EUA).  This EUA will remain in effect (meaning this te st can be used) for the  duration of  the COVID-19 declaration under Section 564(b)(1) of the Act, 21 U.S.C. section 360-bbb-3(b)(1), unless the authorization is terminated or revoked sooner. Performed at [facility], [place] [address]  [place], [place] [address]      Time coordinating discharge: 45 minutes  SIGNED:   Tawni Millers, MD  [facility] 07/17/2019, 8:47 AM

## 2019-07-17 NOTE — Plan of Care (Signed)
Discharge instructions reviewed with patient, questions answered, verbalized understanding. Patient transported to main entrance to be taken home by family member.  °

## 2019-07-18 LAB — CULTURE, BLOOD (ROUTINE X 2)
Culture: NO GROWTH
Culture: NO GROWTH
Special Requests: ADEQUATE
Special Requests: ADEQUATE
